# Patient Record
Sex: Male | Born: 1985
Health system: Southern US, Community
[De-identification: ages and names within clinical notes are randomized; demographics above are authoritative.]

## PROBLEM LIST (undated history)

## (undated) DIAGNOSIS — T783XXA Angioneurotic edema, initial encounter: Secondary | ICD-10-CM

## (undated) HISTORY — DX: Angioneurotic edema, initial encounter: T78.3XXA

## (undated) HISTORY — PX: INCISION AND DRAINAGE OF PERITONSILLAR ABCESS: SHX6257

---

## 2010-08-27 ENCOUNTER — Emergency Department (HOSPITAL_COMMUNITY): Payer: BC Managed Care – PPO

## 2010-08-27 ENCOUNTER — Inpatient Hospital Stay (HOSPITAL_COMMUNITY)
Admission: EM | Admit: 2010-08-27 | Discharge: 2010-09-01 | DRG: 057 | Disposition: A | Discharge status: Home / Self Care | Payer: BC Managed Care – PPO | Attending: Otolaryngology | Admitting: Otolaryngology

## 2010-08-27 DIAGNOSIS — J36 Peritonsillar abscess: Principal | ICD-10-CM | POA: Diagnosis present

## 2010-08-27 LAB — DIFFERENTIAL
Basophils Absolute: 0 10*3/uL (ref 0.0–0.1)
Basophils Relative: 0 % (ref 0–1)
Lymphocytes Relative: 10 % — ABNORMAL LOW (ref 12–46)
Monocytes Absolute: 1.5 10*3/uL — ABNORMAL HIGH (ref 0.1–1.0)
Neutro Abs: 11.2 10*3/uL — ABNORMAL HIGH (ref 1.7–7.7)
Neutrophils Relative %: 78 % — ABNORMAL HIGH (ref 43–77)

## 2010-08-27 LAB — CBC
Hemoglobin: 15.3 g/dL (ref 13.0–17.0)
MCHC: 34.5 g/dL (ref 30.0–36.0)
RBC: 5.09 MIL/uL (ref 4.22–5.81)
WBC: 14.4 10*3/uL — ABNORMAL HIGH (ref 4.0–10.5)

## 2010-08-27 LAB — POCT I-STAT, CHEM 8
Chloride: 103 mEq/L (ref 96–112)
HCT: 47 % (ref 39.0–52.0)
Hemoglobin: 16 g/dL (ref 13.0–17.0)
Potassium: 4.6 mEq/L (ref 3.5–5.1)
Sodium: 139 mEq/L (ref 135–145)

## 2010-08-27 LAB — RAPID STREP SCREEN (MED CTR MEBANE ONLY): Streptococcus, Group A Screen (Direct): NEGATIVE

## 2010-08-27 MED ORDER — IOHEXOL 300 MG/ML  SOLN
75.0000 mL | Freq: Once | INTRAMUSCULAR | Status: AC | PRN
  Administered 2010-08-27: 75 mL via INTRAVENOUS

## 2010-08-28 LAB — CBC
Hemoglobin: 14.1 g/dL (ref 13.0–17.0)
MCV: 87 fL (ref 78.0–100.0)
Platelets: 259 10*3/uL (ref 150–400)
RBC: 4.78 MIL/uL (ref 4.22–5.81)
WBC: 14.3 10*3/uL — ABNORMAL HIGH (ref 4.0–10.5)

## 2010-08-28 LAB — DIFFERENTIAL
Eosinophils Absolute: 0.1 10*3/uL (ref 0.0–0.7)
Lymphs Abs: 1.8 10*3/uL (ref 0.7–4.0)
Neutro Abs: 10.7 10*3/uL — ABNORMAL HIGH (ref 1.7–7.7)
Neutrophils Relative %: 75 % (ref 43–77)

## 2010-08-28 LAB — MRSA PCR SCREENING: MRSA by PCR: NEGATIVE

## 2010-08-29 ENCOUNTER — Inpatient Hospital Stay (HOSPITAL_COMMUNITY): Payer: BC Managed Care – PPO

## 2010-08-29 LAB — CBC
HCT: 40.4 % (ref 39.0–52.0)
Hemoglobin: 13.9 g/dL (ref 13.0–17.0)
MCHC: 34.4 g/dL (ref 30.0–36.0)
WBC: 13.7 10*3/uL — ABNORMAL HIGH (ref 4.0–10.5)

## 2010-08-29 MED ORDER — IOHEXOL 300 MG/ML  SOLN: 75.0000 mL | Freq: Once | INTRAMUSCULAR | Status: AC | PRN

## 2010-08-29 NOTE — H&P (Signed)
NAMETORRE, SCHAUMBURG              ACCOUNT NO.:  [id number]  MEDICAL RECORD NO.:  [id number]           PATIENT TYPE:  I  LOCATION:  2918                         FACILITY:  [facility]  PHYSICIAN:  Zola Button T. Lazarus Salines, M.D. DATE OF BIRTH:  1986/01/02  DATE OF ADMISSION:  08/27/2010 DATE OF DISCHARGE:                             HISTORY & PHYSICAL   CHIEF COMPLAINT:  Sore throat.  HISTORY OF PRESENT ILLNESS:  A 25 year old white male noticed the onset of pain first in his left neck like he slept on a wrong 4 days ago. Subsequently, continued pain in his neck and pain with swallowing, progressively worse.  No voice change or airway difficulty.  No fever. No prior similar events.  No immune dysfunction or risk factors.  No bad teeth.  No past history of tonsillitis or strep throat.  He was evaluated in the emergency room including white blood cell count 14,000 and a CT scan with contrast of the neck which shows a thin retropharyngeal fluid accumulation and some parapharyngeal thickening without fluid as well as some loss of the cervical lordosis.  ENT was called in consultation for assistance with management of a presumed infectious process.  The patient denies foreign body ingestion.  He does not smoke nor drink.  No identified bed teeth or recent upper respiratory infections.  No trauma to the neck or throat.  PAST MEDICAL HISTORY:  No known allergies.  He has had wisdom teeth extracted.  No current active medical conditions.  SOCIAL HISTORY:  He is single.  He works in a [employer], contracting. He does not smoke nor drink.  Does not use street drugs.  FAMILY HISTORY:  Negative for anesthesia reactions or bleeding disorders.  REVIEW OF SYSTEMS:  Negative for bleeding disorders.  No chest pain or shortness of breath, belly pain, difficulty passing urine or bowel movements, history of blood clots.  PHYSICAL EXAMINATION:  VITAL SIGNS:  Pulse 103, respirations 16, blood pressure 135/85,  temperature 99.0 degrees. GENERAL:  This is a stocky, robust-appearing young adult white male. Mental status is appropriate. HEENT:  The head is atraumatic and the neck is reasonably supple without pain.  He has a small laceration over his left medial brow from some trauma sustained splitting wood earlier today.  He does not have hot potato voice nor any fetor oris.  He is not warm to touch.  Ears are clear.  Anterior nose shows a rightward septal deviation.  Oral cavity is moist with teeth in good repair.  No obvious bad teeth.  No swelling of the tongue or lips.  The oropharynx shows a 4+ left tonsil and the 3+ right tonsil without obvious exudate.  There is no palatal erythema, swelling or asymmetry.  The uvula is small and in the midline.  I could not further examine the nasopharynx or hypopharynx. NECK:  Tender in the left jugulodigastric region without discrete adenopathy. LUNGS:  Clear to auscultation. HEART:  Regular rate and rhythm and no murmurs. ABDOMEN:  Soft and active. EXTREMITIES:  Normal configuration. SKIN:  Intact. NEUROLOGIC:  Intact.  Following 5 mL of viscous Xylocaine to the nose,  the flexible laryngoscope was introduced.  The nose is clear on both sides. Nasopharynx is clear with normal eustachian tori.  Oropharynx is narrow secondary to large tonsils without obvious exudate.  There is some fullness of the left lateral pharynx with some eventration of the left piriform sinus.  The base of tongue shows moderately prominent lingual tonsils without exudate.  The epiglottis is normal.  Vocal cords are normal and mobile.  There is no significant laryngeal swelling, although there is some obliteration of the left piriform sinus secondary to lateral swelling.  With palpation, the left soft palate is soft and nontender.  LABORATORY DATA:  I reviewed his CT scan and his white count.  ADMISSION DIAGNOSIS:  Parapharyngeal/retropharyngeal phlegmon.  PLAN:  We will  cover him with IV antibiotics.  I talked with the Infectious Disease physicians.  We will choose Unasyn 3 g ams q.6 h.  IV hydration.  We will observe carefully for any clinical progression, which might necessitate a repeat CT scan and possibly even a surgical exploration.     Gloris Manchester. Lazarus Salines, M.D.     KTW/MEDQ  D:  08/27/2010  T:  08/28/2010  Job:  [id number]  Electronically Signed by Flo Shanks M.D. on 08/29/2010 10:50:32 AM

## 2010-08-31 LAB — DIFFERENTIAL
Eosinophils Relative: 3 % (ref 0–5)
Lymphocytes Relative: 25 % (ref 12–46)
Lymphs Abs: 2 10*3/uL (ref 0.7–4.0)
Monocytes Absolute: 1 10*3/uL (ref 0.1–1.0)

## 2010-08-31 LAB — CBC
HCT: 40.5 % (ref 39.0–52.0)
MCH: 29.6 pg (ref 26.0–34.0)
MCHC: 34.1 g/dL (ref 30.0–36.0)
MCV: 86.7 fL (ref 78.0–100.0)
RDW: 12.7 % (ref 11.5–15.5)

## 2010-09-02 LAB — CULTURE, ROUTINE-ABSCESS

## 2010-09-03 LAB — ANAEROBIC CULTURE

## 2010-09-07 NOTE — Discharge Summary (Signed)
Jesse Oneill, Jesse Oneill              ACCOUNT NO.:  [id number]  MEDICAL RECORD NO.:  [id number]           PATIENT TYPE:  I  LOCATION:  5157                         FACILITY:  [facility]  PHYSICIAN:  Zola Button T. Lazarus Salines, M.D. DATE OF BIRTH:  January 12, 1986  DATE OF ADMISSION:  08/27/2010 DATE OF DISCHARGE:  09/01/2010                              DISCHARGE SUMMARY   CHIEF COMPLAINT:  Sore throat.  HISTORY OF PRESENT ILLNESS:  A 25 year old white male noticed the onset of pain first in his left neck like he slept on it wrong 4 days prior to admission.  Subsequently, he had continued pain in his neck and developed pain in his throat, especially with swallowing.  This has been progressively worse.  No voice change or breathing difficulty.  No documented fever.  No prior similar events.  No immune risks or dysfunction.  No obvious dental sources.  No past history of tonsillitis or strep throat.  He was evaluated in the emergency room today with a white blood cell count of 14,000 and a CT scan with contrast of the neck which showed a thin retropharyngeal fluid accumulation, some loss of the cervical lordosis, and some edema of the parapharyngeal tissues.  ENT was called in consultation for management of presumed infectious process.  The patient has no history of trauma to the throat or neck. No foreign body ingestion.  He does not smoke nor drink.  PAST MEDICAL HISTORY:  No known allergies.  No current medications.  He has had his wisdom teeth extracted.  No current active medical conditions.  SOCIAL HISTORY:  He is single.  He works in a Civil engineer, contracting. He does not smoke nor drink.  No street drugs.  FAMILY HISTORY:  Negative for anesthesia reactions or bleeding disorders.  REVIEW OF SYSTEMS:  Negative for bleeding disorders, chest pain, shortness of breath, belly pain, difficulty passing urine or bowel movements or history of deep vein thrombosis.  PHYSICAL EXAMINATION:  GENERAL:   This is a robust but distressed young adult white male.  His voice is clear.  He moves his neck with some reluctance. VITAL SIGNS:  Pulse 103, respirations 16, blood pressure 135/85, temperature 99.0 degrees Fahrenheit. HEENT:  The head has a small laceration over the medial left brow consistent with a workshop injury earlier today.  This does not require any sutures.  No hot potato voice and no significant fetor oris.  He is not warm to touch.  Ears are clear.  Anterior nose shows a rightward septal deviation.  Oral cavity is moist with teeth in apparent good repair.  No swelling of the tongue or lips.  The oropharynx shows swelling of both tonsils, 4+ on the left, 3+ on the right without obvious exudate.  No palatal erythema, swelling, or asymmetry.  The uvula is small and in the midline.  I could not further examine nasopharynx or hypopharynx.  With the use of viscous lidocaine, flexible laryngoscope was introduced.  The nose is clear on both sides. Nasopharynx is clear with normal eustachian tori.  Oropharynx is narrow secondary to the large tonsils without obvious exudate.  Some fullness  of the left lateral pharynx with eventration of the left piriform sinus. Base of tongue shows prominent lingual tonsils without exudate. Epiglottis is normal.  Vocal cords are normal and mobile.  No significant laryngeal swelling.  Airway is good. NECK:  Examination reveals some tenderness in the left jugulodigastric region without discrete adenopathy. LUNGS:  Clear to auscultation. HEART:  Regular rate and rhythm and no murmurs. ABDOMEN:  Soft, active. EXTREMITIES:  Normal configuration. SKIN:  Intact except for the left brow injury. NEUROLOGIC:  Intact.  ADMISSION DIAGNOSIS:  Left peritonsillar/parapharyngeal/retropharyngeal phlegmon.  HOSPITAL COURSE:  He was admitted to the hospital for hydration and intravenous antibiotic therapy.  Infectious Disease was consulted. Unasyn was chosen.   The following morning roughly 12 hours after his admission, white blood cell count was 14.3 thousand and he was perhaps very slightly less tender in the throat.  He was continued in the Intensive Care Unit on antibiotics.  By the following morning, although he remained afebrile, white blood cell count had not reduced very much to 13.7 thousand and he was noting increased neck stiffness and increasing difficulty with swallowing.  A repeat CT scan of the neck with contrast was obtained which showed some increased fullness in the left parapharyngeal region.  Later that same day, he was taken to the operating room where under general anesthesia, exploration of the left peritonsillar space was performed.  Frank pus was encountered at the deep inferior pole either peritonsillar or parapharyngeal which was thoroughly explored bluntly.  The external neck was not opened at this point.  He remained in the Intensive Care Unit. On the fourth hospital day which corresponds to postoperative day #1, he feels like his throat is actually some better.  He was continued on intravenous Unasyn.  By the second postoperative day, he was continuingto feel better and white blood cell count was now 7.8 thousand.  He was transferred to oral Augmentin and the intravenous Unasyn was discontinued.  He was able to advance oral diet.  On hospital day #6, postoperative day #3, he continued to make improvement and was discharged to his home in the care of his family.  FINAL DIAGNOSIS:  Left peritonsillar and parapharyngeal abscess.  PROCEDURE PERFORMED:  Incision and drainage of intraoral left peritonsillar and parapharyngeal abscess, May 29.  COMPLICATIONS:  None.  CONDITION ON DISCHARGE:  Ambulatory.  No apparent progression of infection, on p.o. antibiotics.  Taking a p.o. diet.  Pain improving. Cultures, no growth to date.  Return visit 5 days.  Prescriptions Augmentin and Lortab liquid.  A work excuse was  written for this entire week to return to work on June 4.  Discharge instructions were written and given.     Gloris Manchester. Lazarus Salines, M.D.     KTW/MEDQ  D:  09/01/2010  T:  09/01/2010  Job:  [id number]  Electronically Signed by Flo Shanks M.D. on 09/07/2010 06:18:04 PM

## 2010-09-07 NOTE — Op Note (Signed)
NAMEJOJUAN, CHAMPNEY              ACCOUNT NO.:  [id number]  MEDICAL RECORD NO.:  [id number]           PATIENT TYPE:  I  LOCATION:  2918                         FACILITY:  [facility]  PHYSICIAN:  Zola Button T. Lazarus Salines, M.D. DATE OF BIRTH:  25-Aug-1985  DATE OF PROCEDURE:  08/29/2010 DATE OF DISCHARGE:                              OPERATIVE REPORT   PREOPERATIVE DIAGNOSIS:  Left peritonsillar/parapharyngeal abscess.  POSTOPERATIVE DIAGNOSIS:  Left peritonsillar/parapharyngeal abscess.  PROCEDURE PERFORMED:  Incision and drainage, intraoral, left peritonsillar/parapharyngeal abscess.  SURGEON:  Gloris Manchester. Shereda Graw, MD  ANESTHESIA:  General orotracheal.  BLOOD LOSS:  Minimal.  COMPLICATIONS:  None.  FINDINGS:  3+ right tonsil.  4+ bulging left tonsil with some erythema of the anterior tonsillar pillar but minimal induration.  Normal soft palate.  The posterior pharyngeal wall without any significant palpable thickness or fluctuance.  Left neck with slight fullness in the jugulodigastric region but without frank induration or cellulitis of the skin.  PROCEDURE:  With the patient in a comfortable supine position, general orotracheal anesthesia was induced without difficulty.  At an appropriate level, the table was turned 90 degrees and the patient placed in slight reverse Trendelenburg.  The Anesthesia did notice asymmetric oropharyngeal and hypopharyngeal swelling at the time of intubation.  The epiglottis was basically normal.  Taking care to protect lips, teeth, and endotracheal tube, the Crowe- Davis mouth gag was introduced, expanded for visualization, and suspended from the Mayo stand in the standard fashion.  The findings were as described above.  The oral cavity was carefully palpated as was nasopharynx and hypopharynx.  The neck was also palpated with the findings as described above.  A 3-cm crescent incision was made in front of the anterior and superior poles of the left  tonsil and using the cautery tip for cutting, coagulating, and as a blunt dissector, the peritonsillar plane was developed.  There was mild oozing consistent with inflammation which was controlled with cautery.  There was no obvious purulent collection behind the superior pole or mid pole.  Palpating further down using a large blunt Kelly hemostat, frank pus was encountered lateral and deep to the inferior pole of the left tonsil.  This was cultured for aerobes and anaerobes.  Further blunt dissection with the blunt hemostat and with a fingertip down into the parapharyngeal space was performed with no additional loculations of pus encountered.  The area was thoroughly explored digitally.  Following this, the area was thoroughly irrigated with cool saline.  Hemostasis occurred spontaneously.  After ascertaining hemostasis, the procedure was completed.  The mouth gag was relaxed and removed.  The dental status was intact.  The patient was returned to Anesthesia, awakened, extubated, and transferred to recovery in stable condition.COMMENT:  A 25 year old white male with a 4 - 5-day history of progressively severe left sore throat with an admission CT scan 2 days ago showing early parapharyngeal and retropharyngeal thickening and possible fluid accumulation.  He has been maintained on intravenous Unasyn for 2 days, but clinically is slowly deteriorating with increased neck stiffness and pain with swallowing.  Repeat CT scan today suggested increased swelling in the  parapharyngeal region, hence the indication for today's procedure.  Anticipate routine postoperative recovery with attention to oral hygiene measures, antibiotics.  We will wait the culture results.     Gloris Manchester. Lazarus Salines, M.D.     KTW/MEDQ  D:  08/29/2010  T:  08/30/2010  Job:  [id number]  Electronically Signed by Flo Shanks M.D. on 09/07/2010 06:18:01 PM

## 2012-09-08 ENCOUNTER — Other Ambulatory Visit: Payer: Self-pay | Admitting: Occupational Medicine

## 2012-09-08 ENCOUNTER — Ambulatory Visit
Admission: RE | Admit: 2012-09-08 | Discharge: 2012-09-08 | Disposition: A | Discharge status: Home / Self Care | Payer: No Typology Code available for payment source | Source: Ambulatory Visit | Attending: Occupational Medicine | Admitting: Occupational Medicine

## 2012-09-08 DIAGNOSIS — Z021 Encounter for pre-employment examination: Secondary | ICD-10-CM

## 2014-03-27 ENCOUNTER — Encounter (HOSPITAL_COMMUNITY): Payer: Self-pay | Admitting: Emergency Medicine

## 2014-03-27 ENCOUNTER — Emergency Department (INDEPENDENT_AMBULATORY_CARE_PROVIDER_SITE_OTHER)
Admission: EM | Admit: 2014-03-27 | Discharge: 2014-03-27 | Disposition: A | Discharge status: Home / Self Care | Payer: Worker's Compensation | Source: Home / Self Care | Attending: Family Medicine | Admitting: Family Medicine

## 2014-03-27 ENCOUNTER — Emergency Department (INDEPENDENT_AMBULATORY_CARE_PROVIDER_SITE_OTHER): Payer: Worker's Compensation

## 2014-03-27 DIAGNOSIS — W19XXXA Unspecified fall, initial encounter: Secondary | ICD-10-CM

## 2014-03-27 DIAGNOSIS — S80212A Abrasion, left knee, initial encounter: Secondary | ICD-10-CM

## 2014-03-27 DIAGNOSIS — S8002XA Contusion of left knee, initial encounter: Secondary | ICD-10-CM | POA: Diagnosis not present
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
## 2014-03-27 NOTE — ED Notes (Signed)
Patient injured knee running, he fell knee soreness.

## 2014-03-27 NOTE — Discharge Instructions (Signed)
Your xrays are without evidence of injury to bones. No fracture or dislocation. Ice and elevation to reduce swelling. Ibuprofen as directed on packaging for pain. ACE wrap as needed for comfort. Limit sports and impact exercise for the next 1-2 weeks. If no improvement, [facility]    Contusion A contusion is a deep bruise. Contusions are the result of an injury that caused bleeding under the skin. The contusion may turn blue, purple, or yellow. Minor injuries will give you a painless contusion, but more severe contusions may stay painful and swollen for a few weeks.  CAUSES  A contusion is usually caused by a blow, trauma, or direct force to an area of the body. SYMPTOMS   Swelling and redness of the injured area.  Bruising of the injured area.  Tenderness and soreness of the injured area.  Pain. DIAGNOSIS  The diagnosis can be made by taking a history and physical exam. An X-ray, CT scan, or MRI may be needed to determine if there were any associated injuries, such as fractures. TREATMENT  Specific treatment will depend on what area of the body was injured. In general, the best treatment for a contusion is resting, icing, elevating, and applying cold compresses to the injured area. Over-the-counter medicines may also be recommended for pain control. Ask your caregiver what the best treatment is for your contusion. HOME CARE INSTRUCTIONS   Put ice on the injured area.  Put ice in a plastic bag.  Place a towel between your skin and the bag.  Leave the ice on for 15-20 minutes, 3-4 times a day, or as directed by your health care provider.  Only take over-the-counter or prescription medicines for pain, discomfort, or fever as directed by your caregiver. Your caregiver may recommend avoiding anti-inflammatory medicines (aspirin, ibuprofen, and naproxen) for 48 hours because these medicines may increase bruising.  Rest the injured area.  If possible,  elevate the injured area to reduce swelling. SEEK IMMEDIATE MEDICAL CARE IF:   You have increased bruising or swelling.  You have pain that is getting worse.  Your swelling or pain is not relieved with medicines. MAKE SURE YOU:   Understand these instructions.  Will watch your condition.  Will get help right away if you are not doing well or get worse. Document Released: 12/27/2004 Document Revised: 03/24/2013 Document Reviewed: 01/22/2011 [facility] Patient Information 2015 [employer], [place]. This information is not intended to replace advice given to you by your health care provider. Make sure you discuss any questions you have with your health care provider.  Knee Effusion The medical term for having fluid in your knee is effusion. This is often due to an internal derangement of the knee. This means something is wrong inside the knee. Some of the causes of fluid in the knee may be torn cartilage, a torn ligament, or bleeding into the joint from an injury. Your knee is likely more difficult to bend and move. This is often because there is increased pain and pressure in the joint. The time it takes for recovery from a knee effusion depends on different factors, including:   Type of injury.  Your age.  Physical and medical conditions.  Rehabilitation Strategies. How long you will be away from your normal activities will depend on what kind of knee problem you have and how much damage is present. Your knee has two types of cartilage. Articular cartilage covers the bone ends and lets your knee bend and  move smoothly. Two menisci, thick pads of cartilage that form a rim inside the joint, help absorb shock and stabilize your knee. Ligaments bind the bones together and support your knee joint. Muscles move the joint, help support your knee, and take stress off the joint itself. CAUSES  Often an effusion in the knee is caused by an injury to one of the menisci. This is often a tear in the  cartilage. Recovery after a meniscus injury depends on how much meniscus is damaged and whether you have damaged other knee tissue. Small tears may heal on their own with conservative treatment. Conservative means rest, limited weight bearing activity and muscle strengthening exercises. Your recovery may take up to 6 weeks.  TREATMENT  Larger tears may require surgery. Meniscus injuries may be treated during arthroscopy. Arthroscopy is a procedure in which your surgeon uses a small telescope like instrument to look in your knee. Your caregiver can make a more accurate diagnosis (learning what is wrong) by performing an arthroscopic procedure. If your injury is on the inner margin of the meniscus, your surgeon may trim the meniscus back to a smooth rim. In other cases your surgeon will try to repair a damaged meniscus with stitches (sutures). This may make rehabilitation take longer, but may provide better long term result by helping your knee keep its shock absorption capabilities. Ligaments which are completely torn usually require surgery for repair. HOME CARE INSTRUCTIONS  Use crutches as instructed.  If a brace is applied, use as directed.  Once you are home, an ice pack applied to your swollen knee may help with discomfort and help decrease swelling.  Keep your knee raised (elevated) when you are not up and around or on crutches.  Only take over-the-counter or prescription medicines for pain, discomfort, or fever as directed by your caregiver.  Your caregivers will help with instructions for rehabilitation of your knee. This often includes strengthening exercises.  You may resume a normal diet and activities as directed. SEEK MEDICAL CARE IF:   There is increased swelling in your knee.  You notice redness, swelling, or increasing pain in your knee.  An unexplained oral temperature above 102 F (38.9 C) develops. SEEK IMMEDIATE MEDICAL CARE IF:   You develop a rash.  You have  difficulty breathing.  You have any allergic reactions from medications you may have been given.  There is severe pain with any motion of the knee. MAKE SURE YOU:   Understand these instructions.  Will watch your condition.  Will get help right away if you are not doing well or get worse. Document Released: 06/09/2003 Document Revised: 06/11/2011 Document Reviewed: 08/13/2007 [facility] Patient Information 2015 [employer], [place]. This information is not intended to replace advice given to you by your health care provider. Make sure you discuss any questions you have with your health care provider.

## 2015-02-01 LAB — HM HEPATITIS C SCREENING LAB: HM Hepatitis Screen: NEGATIVE

## 2015-08-03 IMAGING — CR DG KNEE COMPLETE 4+V*L*
5 series · 5 of 5 positions shown · non-contrast
Comparison: None.

CLINICAL DATA: Left anterior knee pain and abrasions post fall last
night

EXAM:
LEFT KNEE - COMPLETE 4+ VIEW

[knee ap]
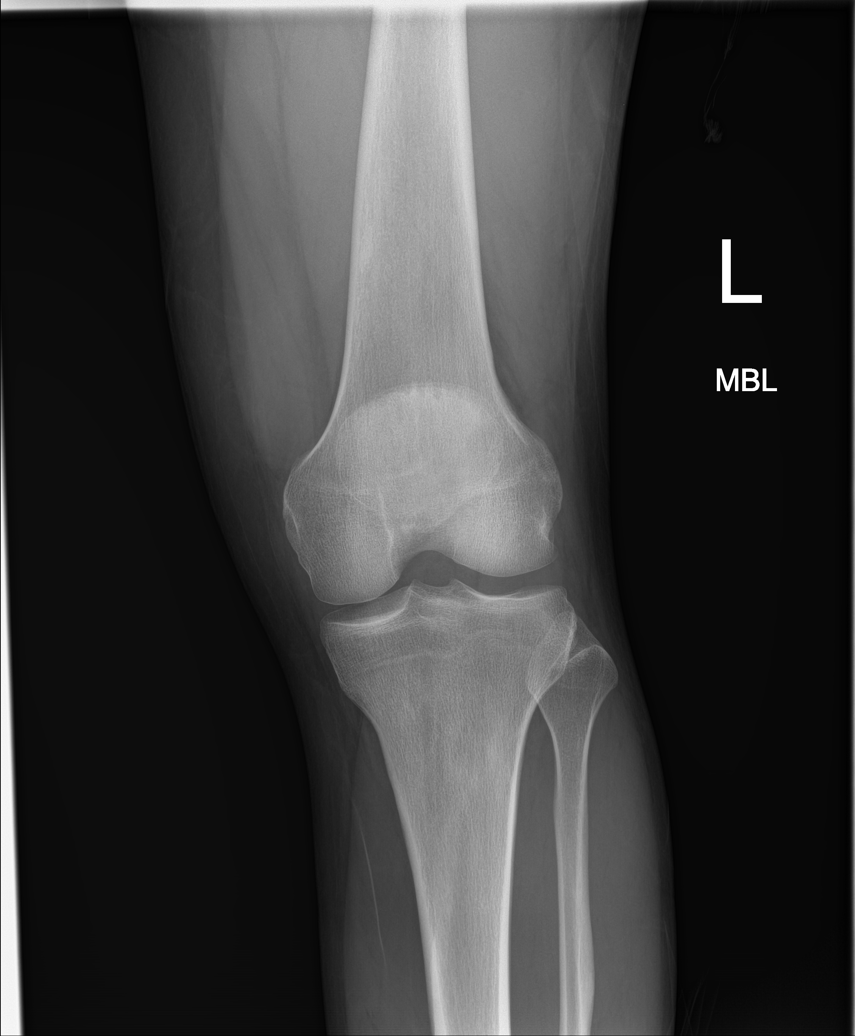

[knee lat (1 of 2)]
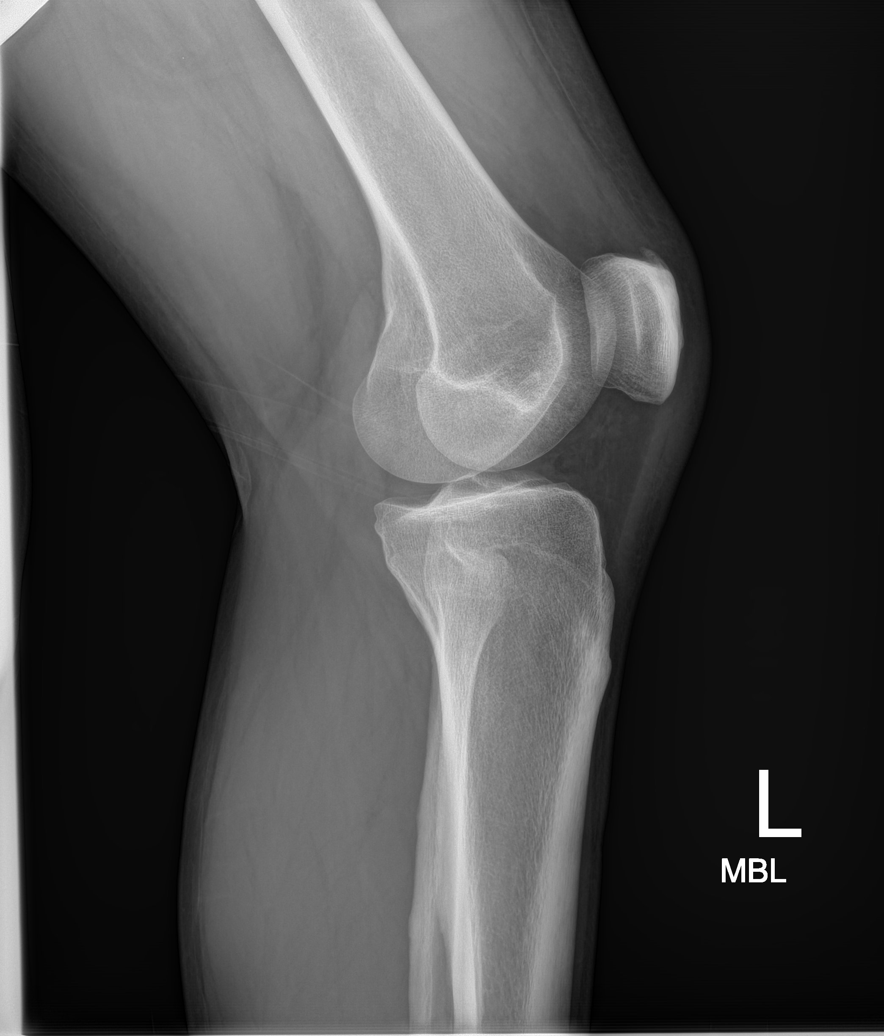

[knee obl (1 of 2)]
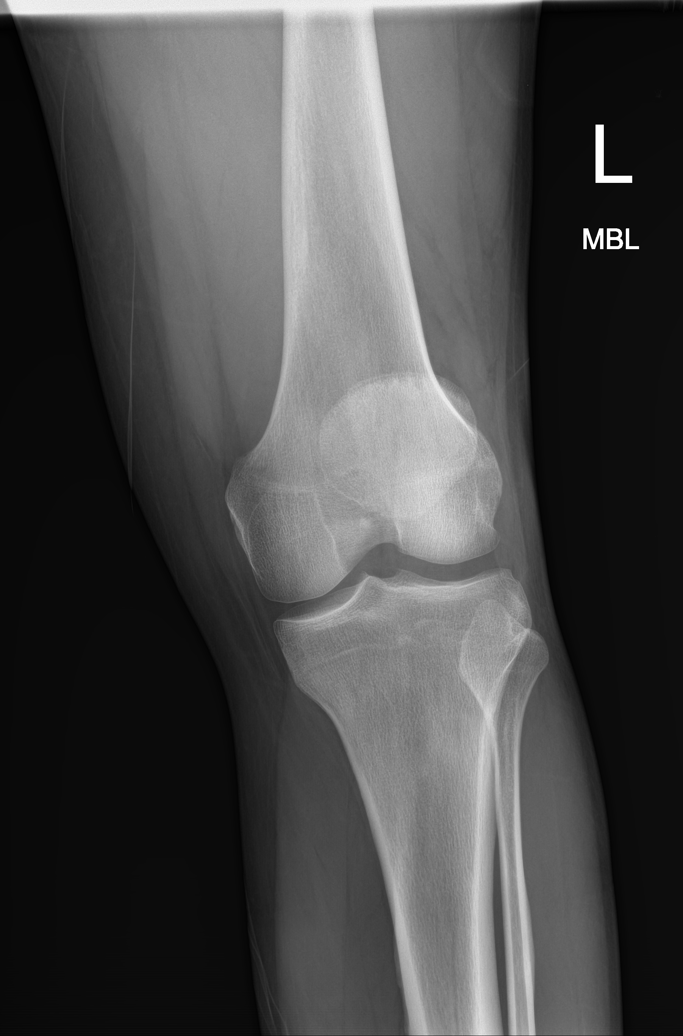

[knee obl (2 of 2)]
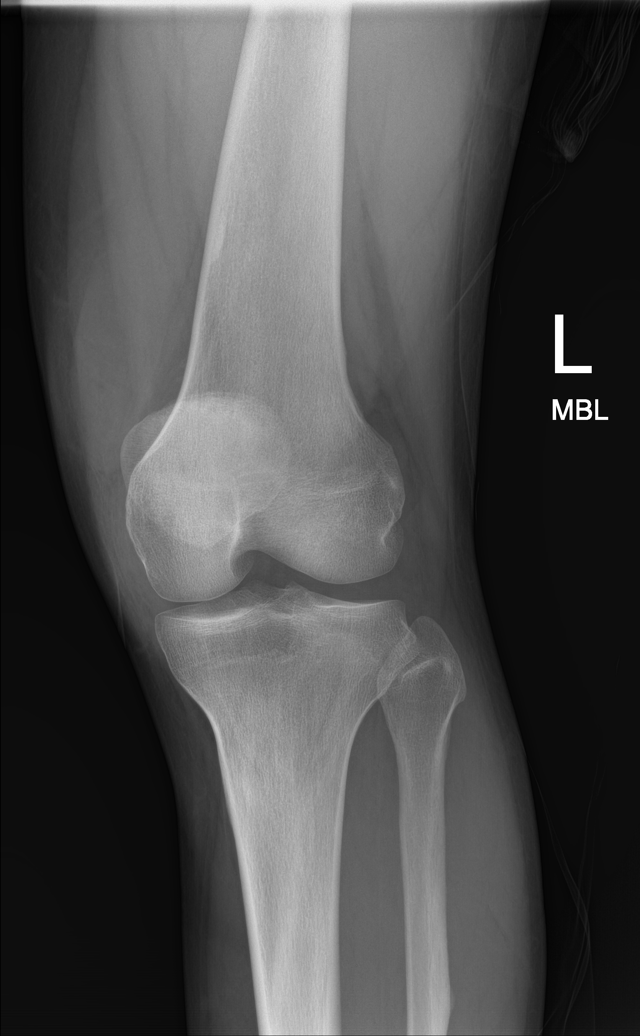

[knee lat (2 of 2)]
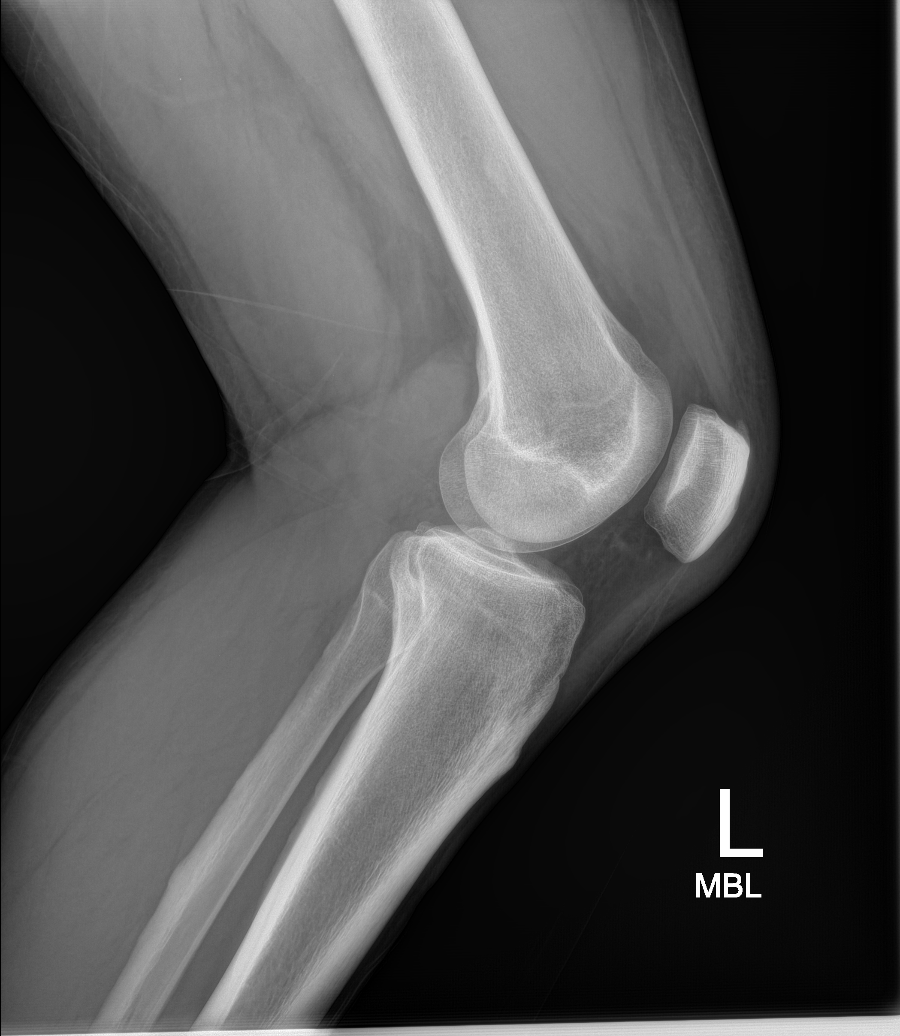

[5 of 5 positions shown; findings below may reference images not displayed]

FINDINGS: Five views of the left knee submitted. No acute fracture or
subluxation. Mild narrowing of medial joint compartment. Mild
spurring of patella. Mild prepatellar soft tissue swelling. No joint
effusion.
IMPRESSION: No acute fracture or subluxation. Mild narrowing of medial joint
compartment. Mild spurring of patella. Mild prepatellar soft tissue
swelling.

## 2016-01-10 ENCOUNTER — Ambulatory Visit (INDEPENDENT_AMBULATORY_CARE_PROVIDER_SITE_OTHER): Payer: 59 | Admitting: Family Medicine

## 2016-01-10 ENCOUNTER — Encounter: Payer: Self-pay | Admitting: Family Medicine

## 2016-01-10 ENCOUNTER — Ambulatory Visit: Payer: Self-pay | Admitting: Family Medicine

## 2016-01-10 VITALS — BP 122/82 | HR 73 | Temp 98.6°F | Ht 75.0 in | Wt 250.5 lb

## 2016-01-10 DIAGNOSIS — Z7189 Other specified counseling: Secondary | ICD-10-CM

## 2016-01-10 DIAGNOSIS — Z0000 Encounter for general adult medical examination without abnormal findings: Secondary | ICD-10-CM

## 2016-01-10 NOTE — Progress Notes (Signed)
Pre visit review using our clinic review tool, if applicable. No additional management support is needed unless otherwise documented below in the visit note. 

## 2016-01-10 NOTE — Patient Instructions (Signed)
Take care.  Glad to see you. Update me as needed.  

## 2016-01-11 ENCOUNTER — Encounter: Payer: Self-pay | Admitting: Family Medicine

## 2016-01-11 DIAGNOSIS — Z7189 Other specified counseling: Secondary | ICD-10-CM | POA: Insufficient documentation

## 2016-01-11 DIAGNOSIS — Z0000 Encounter for general adult medical examination without abnormal findings: Secondary | ICD-10-CM | POA: Insufficient documentation

## 2016-01-11 NOTE — Assessment & Plan Note (Signed)
Tetanus 2015 Flu to be done at work.  PNA and shingles not due.  Colon and prostate cancer screening not due D/w pt about diet and exercise.   Advance directive d/w pt.  Wife designated if patient were incapacitated.   [facility].

## 2016-01-11 NOTE — Assessment & Plan Note (Signed)
Advance directive d/w pt.  Wife designated if patient were incapacitated.   
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
## 2018-11-22 ENCOUNTER — Encounter (HOSPITAL_COMMUNITY): Payer: Self-pay

## 2018-11-22 ENCOUNTER — Other Ambulatory Visit: Payer: Self-pay

## 2018-11-22 ENCOUNTER — Emergency Department (HOSPITAL_COMMUNITY)
Admission: EM | Admit: 2018-11-22 | Discharge: 2018-11-22 | Disposition: A | Discharge status: Home / Self Care | Payer: No Typology Code available for payment source | Attending: Emergency Medicine | Admitting: Emergency Medicine

## 2018-11-22 DIAGNOSIS — W01118A Fall on same level from slipping, tripping and stumbling with subsequent striking against other sharp object, initial encounter: Secondary | ICD-10-CM | POA: Diagnosis not present

## 2018-11-22 DIAGNOSIS — Y990 Civilian activity done for income or pay: Secondary | ICD-10-CM | POA: Insufficient documentation

## 2018-11-22 DIAGNOSIS — S81811A Laceration without foreign body, right lower leg, initial encounter: Secondary | ICD-10-CM | POA: Diagnosis present

## 2018-11-22 DIAGNOSIS — Y939 Activity, unspecified: Secondary | ICD-10-CM | POA: Diagnosis not present

## 2018-11-22 DIAGNOSIS — Y929 Unspecified place or not applicable: Secondary | ICD-10-CM | POA: Diagnosis not present

## 2018-11-22 MED ORDER — HYDROCODONE-ACETAMINOPHEN 5-325 MG PO TABS: 1.0000 | ORAL_TABLET | ORAL | 0 refills | Status: DC | PRN

## 2018-11-22 MED ORDER — IBUPROFEN 600 MG PO TABS: 600.0000 mg | ORAL_TABLET | Freq: Four times a day (QID) | ORAL | 0 refills | Status: DC | PRN

## 2018-11-22 MED ORDER — LIDOCAINE-EPINEPHRINE (PF) 2 %-1:200000 IJ SOLN
10.0000 mL | Freq: Once | INTRAMUSCULAR | Status: AC
  Administered 2018-11-22: 10 mL
  Filled 2018-11-22: qty 10

## 2018-11-22 NOTE — ED Provider Notes (Signed)
[facility]-EMERGENCY DEPT Provider Note   CSN: [id number] Arrival date & time: 11/22/18  0341     History   Chief Complaint Chief Complaint  Patient presents with  . Laceration    HPI Jesse Oneill is a 33 y.o. male.     Patient to ED with laceration to right lower leg. He fell against an exposed edge of sheet metal just prior to arrival in the ED. No other injury. No numbness or active bleeding. Tetanus without 3 years.   The history is provided by the patient. No language interpreter was used.  Laceration   History reviewed. No pertinent past medical history.  Patient Active Problem List   Diagnosis Date Noted  . Routine general medical examination at a health care facility 01/11/2016  . Advance care planning 01/11/2016    Past Surgical History:  Procedure Laterality Date  . INCISION AND DRAINAGE OF PERITONSILLAR ABCESS          Home Medications    Prior to Admission medications   Not on File    Family History Family History  Problem Relation Age of Onset  . Colon cancer Neg Hx   . Prostate cancer Neg Hx     Social History Social History   Tobacco Use  . Smoking status: Never Smoker  . Smokeless tobacco: Never Used  Substance Use Topics  . Alcohol use: No  . Drug use: No     Allergies   Patient has no known allergies.   Review of Systems Review of Systems  Musculoskeletal:       See HPI.  Skin: Positive for wound.  Neurological: Negative for numbness.     Physical Exam Updated Vital Signs BP (!) 136/97 (BP Location: Left Arm)   Pulse 95   Temp 98.3 F (36.8 C) (Oral)   Resp 16   SpO2 100%   Physical Exam Vitals signs and nursing note reviewed.  Constitutional:      Appearance: Normal appearance.  Musculoskeletal: Normal range of motion.        General: No swelling.  Skin:    General: Skin is warm and dry.     Comments: 9 cm flap laceration to anterolateral, midshaft right lower leg. No active  bleeding. No surrounding swelling or redness.   Neurological:     Mental Status: He is alert.      ED Treatments / Results  Labs (all labs ordered are listed, but only abnormal results are displayed) Labs Reviewed - No data to display  EKG None  Radiology No results found.  Procedures .Marland Kitchen.Laceration Repair  Date/Time: 11/22/2018 6:37 AM Performed by: Elpidio AnisUpstill, Janele Lague, PA-C Authorized by: Elpidio AnisUpstill, Dionna Wiedemann, PA-C   Consent:    Consent obtained:  Verbal   Consent given by:  Patient Anesthesia (see MAR for exact dosages):    Anesthesia method:  Local infiltration   Local anesthetic:  Lidocaine 2% WITH epi Laceration details:    Location:  Leg   Leg location:  R lower leg   Length (cm):  9 Repair type:    Repair type:  Intermediate Pre-procedure details:    Preparation:  Patient was prepped and draped in usual sterile fashion Exploration:    Hemostasis achieved with:  Direct pressure   Wound exploration: entire depth of wound probed and visualized     Wound extent: no foreign bodies/material noted and no muscle damage noted     Contaminated: no   Treatment:    Area cleansed with:  Betadine and saline   Amount of cleaning:  Standard   Irrigation solution:  Sterile saline   Irrigation method:  Syringe Skin repair:    Repair method:  Sutures   Suture size:  3-0   Suture material:  Nylon   Suture technique:  Simple interrupted   Number of sutures:  12 Approximation:    Approximation:  Close Post-procedure details:    Dressing:  Antibiotic ointment and non-adherent dressing   Patient tolerance of procedure:  Tolerated well, no immediate complications   (including critical care time)  Medications Ordered in ED Medications  lidocaine-EPINEPHrine (XYLOCAINE W/EPI) 2 %-1:200000 (PF) injection 10 mL (10 mLs Infiltration Given 11/22/18 0604)     Initial Impression / Assessment and Plan / ED Course  I have reviewed the triage vital signs and the nursing notes.  Pertinent  labs & imaging results that were available during my care of the patient were reviewed by me and considered in my medical decision making (see chart for details).        Patient to ED with full thickness laceration to right lower leg that occurred just before coming to ED tonight. No other injury. Repaired as per above note.   Final Clinical Impressions(s) / ED Diagnoses   Final diagnoses:  None   1. Laceration, right LE  ED Discharge Orders    None       Charlann Lange, PA-C 11/22/18 7829    Shanon Rosser, MD 11/22/18 [phone]

## 2018-11-22 NOTE — ED Triage Notes (Addendum)
Pt reports that a piece of sheet metal sliced the R lower leg about 6 inches above the ankle. Adipose tissue visualized. Bleeding controlled. Ambulatory. Workers comp.

## 2019-01-09 ENCOUNTER — Encounter: Payer: Self-pay | Admitting: Family Medicine

## 2019-01-09 ENCOUNTER — Ambulatory Visit (INDEPENDENT_AMBULATORY_CARE_PROVIDER_SITE_OTHER): Payer: 59 | Admitting: Family Medicine

## 2019-01-09 ENCOUNTER — Other Ambulatory Visit: Payer: Self-pay

## 2019-01-09 VITALS — BP 122/74 | HR 92 | Temp 97.8°F | Ht 77.0 in | Wt 258.8 lb

## 2019-01-09 DIAGNOSIS — Z131 Encounter for screening for diabetes mellitus: Secondary | ICD-10-CM | POA: Diagnosis not present

## 2019-01-09 DIAGNOSIS — Z0000 Encounter for general adult medical examination without abnormal findings: Secondary | ICD-10-CM | POA: Diagnosis not present

## 2019-01-09 DIAGNOSIS — Z8249 Family history of ischemic heart disease and other diseases of the circulatory system: Secondary | ICD-10-CM

## 2019-01-09 DIAGNOSIS — Z7189 Other specified counseling: Secondary | ICD-10-CM

## 2019-01-09 LAB — LIPID PANEL
Cholesterol: 152 mg/dL (ref 0–200)
HDL: 57.3 mg/dL (ref >39.00)
LDL Cholesterol: 79 mg/dL (ref 0–99)
NonHDL: 94.68
Total CHOL/HDL Ratio: 3
Triglycerides: 76 mg/dL (ref 0.0–149.0)
VLDL: 15.2 mg/dL (ref 0.0–40.0)

## 2019-01-09 LAB — GLUCOSE, RANDOM: Glucose, Bld: 88 mg/dL (ref 70–99)

## 2019-01-09 NOTE — Patient Instructions (Signed)
Go to the lab on the way out.  We'll contact you with your lab report. Keep working on diet and exercise.   I would get a flu shot each fall.   Take care.  Glad to see you.  Update me as needed.

## 2019-01-09 NOTE — Progress Notes (Signed)
CPE- See plan.  Routine anticipatory guidance given to patient.  See health maintenance.  The possibility exists that previously documented standard health maintenance information may have been brought forward from a previous encounter into this note.  If needed, that same information has been updated to reflect the current situation based on today's encounter.    Tetanus 2015 Flu to be done at work.  PNA and shingles not due.  Colon and prostate cancer screening not due D/w pt about diet and exercise.   Advance directive d/w pt.  Wife designated if patient were incapacitated.   [facility]    See notes on labs.  He prev had elevated BP out of clinic that improved with diet and exercise.  Prev weight up to 280 per patient report, with intentional weight loss in the meantime.    FH CAD noted but not parents.    PMH and SH reviewed  Meds, vitals, and allergies reviewed.   ROS: Per HPI.  Unless specifically indicated otherwise in HPI, the patient denies:  General: fever. Eyes: acute vision changes ENT: sore throat Cardiovascular: chest pain Respiratory: SOB GI: vomiting GU: dysuria Musculoskeletal: acute back pain Derm: acute rash Neuro: acute motor dysfunction Psych: worsening mood Endocrine: polydipsia Heme: bleeding Allergy: hayfever  GEN: nad, alert and oriented HEENT: ncat NECK: supple w/o LA CV: rrr. PULM: ctab, no inc wob ABD: soft, +bs EXT: no edema SKIN: no acute rash

## 2019-01-11 NOTE — Assessment & Plan Note (Signed)
Tetanus 2015 Flu to be done at work.  PNA and shingles not due.  Colon and prostate cancer screening not due D/w pt about diet and exercise.   Advance directive d/w pt.  Wife designated if patient were incapacitated.   [facility].    He prev had elevated BP out of clinic that improved with diet and exercise.  Prev weight up to 280 per patient report, with intentional weight loss in the meantime.

## 2019-01-11 NOTE — Assessment & Plan Note (Signed)
Advance directive d/w pt.  Wife designated if patient were incapacitated.   

## 2019-01-14 ENCOUNTER — Telehealth: Payer: Self-pay

## 2019-01-14 ENCOUNTER — Encounter: Payer: Self-pay | Admitting: Family Medicine

## 2019-01-14 NOTE — Telephone Encounter (Signed)
I left a message for patient to return phone call regarding lab results.

## 2019-11-12 ENCOUNTER — Ambulatory Visit
Admission: EM | Admit: 2019-11-12 | Discharge: 2019-11-12 | Disposition: A | Discharge status: Home / Self Care | Payer: BC Managed Care – PPO | Attending: Internal Medicine | Admitting: Internal Medicine

## 2019-11-12 ENCOUNTER — Other Ambulatory Visit: Payer: Self-pay

## 2019-11-12 DIAGNOSIS — Z0189 Encounter for other specified special examinations: Secondary | ICD-10-CM

## 2019-11-12 NOTE — Discharge Instructions (Signed)
Your COVID test will take about 48-72 hours to come back. The results will automatically go to your MyChart account; please set this up if you have no already done so. We will only call you if your test results are positive.

## 2019-11-14 LAB — SARS-COV-2, NAA 2 DAY TAT

## 2019-11-14 LAB — NOVEL CORONAVIRUS, NAA: SARS-CoV-2, NAA: NOT DETECTED

## 2021-09-26 ENCOUNTER — Encounter: Payer: BC Managed Care – PPO | Admitting: Family Medicine

## 2021-09-29 ENCOUNTER — Ambulatory Visit (INDEPENDENT_AMBULATORY_CARE_PROVIDER_SITE_OTHER): Payer: BC Managed Care – PPO | Admitting: Family Medicine

## 2021-09-29 ENCOUNTER — Encounter: Payer: Self-pay | Admitting: Family Medicine

## 2021-09-29 VITALS — BP 122/82 | HR 82 | Temp 97.5°F | Ht 77.0 in | Wt 299.0 lb

## 2021-09-29 DIAGNOSIS — R635 Abnormal weight gain: Secondary | ICD-10-CM | POA: Diagnosis not present

## 2021-09-29 DIAGNOSIS — Z7189 Other specified counseling: Secondary | ICD-10-CM

## 2021-09-29 DIAGNOSIS — Z0000 Encounter for general adult medical examination without abnormal findings: Secondary | ICD-10-CM | POA: Diagnosis not present

## 2021-09-29 DIAGNOSIS — R0683 Snoring: Secondary | ICD-10-CM

## 2021-09-29 LAB — BASIC METABOLIC PANEL
BUN: 14 mg/dL (ref 6–23)
CO2: 26 mEq/L (ref 19–32)
Calcium: 9.4 mg/dL (ref 8.4–10.5)
Chloride: 104 mEq/L (ref 96–112)
Creatinine, Ser: 1.05 mg/dL (ref 0.40–1.50)
GFR: 92.63 mL/min (ref >60.00)
Glucose, Bld: 93 mg/dL (ref 70–99)
Potassium: 4.2 mEq/L (ref 3.5–5.1)
Sodium: 139 mEq/L (ref 135–145)

## 2021-09-29 LAB — LIPID PANEL
Cholesterol: 154 mg/dL (ref 0–200)
HDL: 46.1 mg/dL (ref >39.00)
LDL Cholesterol: 73 mg/dL (ref 0–99)
NonHDL: 107.97
Total CHOL/HDL Ratio: 3
Triglycerides: 176 mg/dL — ABNORMAL HIGH (ref 0.0–149.0)
VLDL: 35.2 mg/dL (ref 0.0–40.0)

## 2021-09-29 LAB — TSH: TSH: 1.4 u[IU]/mL (ref 0.35–5.50)

## 2021-09-29 NOTE — Progress Notes (Unsigned)
CPE- See plan.  Routine anticipatory guidance given to patient.  See health maintenance.  The possibility exists that previously documented standard health maintenance information may have been brought forward from a previous encounter into this note.  If needed, that same information has been updated to reflect the current situation based on today's encounter.    Tetanus 2015 Flu d/w pt.  Encouraged for the fall.   Covid vaccine d/w pt.   PNA and shingles not due.  Colon and prostate cancer screening not due D/w pt about diet and exercise.   Advance directive d/w pt.  Wife designated if patient were incapacitated.   [facility] and [facility].     Wife noted patient was having breathing changes at night.  D/w pt about options.  He occ has some fatigue but he has other factors that could contribute to that (with work and raising kids). No red flag events.   Weight is up in the meantime.  See notes on labs.    PMH and SH reviewed  Meds, vitals, and allergies reviewed.   ROS: Per HPI.  Unless specifically indicated otherwise in HPI, the patient denies:  General: fever. Eyes: acute vision changes ENT: sore throat Cardiovascular: chest pain Respiratory: SOB GI: vomiting GU: dysuria Musculoskeletal: acute back pain Derm: acute rash Neuro: acute motor dysfunction Psych: worsening mood Endocrine: polydipsia Heme: bleeding Allergy: hayfever  GEN: nad, alert and oriented HEENT: ncat NECK: supple w/o LA CV: rrr. PULM: ctab, no inc wob ABD: soft, +bs EXT: no edema SKIN: no acute rash

## 2021-09-29 NOTE — Patient Instructions (Signed)
Go to the lab on the way out.   If you have mychart we'll likely use that to update you.    We'll call about seeing pulmonary for consideration of a sleep test.  Take care.  Glad to see you. Use the eat right diet in the meantime.

## 2021-10-01 DIAGNOSIS — G4733 Obstructive sleep apnea (adult) (pediatric): Secondary | ICD-10-CM

## 2021-10-01 DIAGNOSIS — R0683 Snoring: Secondary | ICD-10-CM | POA: Insufficient documentation

## 2021-10-01 HISTORY — DX: Obstructive sleep apnea (adult) (pediatric): G47.33

## 2021-10-01 NOTE — Assessment & Plan Note (Signed)
Discussed possible diagnosis of sleep apnea and sleep apnea pathophysiology.  Refer to pulmonary.  Routine cautions given to patient regarding being drowsy. Weight is up in the meantime.  See notes on labs.   Discussed diet and exercise, packing his lunch, using a right/low-carb diet.  Handout given to patient and explained/understood.

## 2021-10-01 NOTE — Assessment & Plan Note (Signed)
Advance directive d/w pt.  Wife designated if patient were incapacitated.   

## 2021-10-01 NOTE — Assessment & Plan Note (Signed)
Tetanus 2015 Flu d/w pt.  Encouraged for the fall.   Covid vaccine d/w pt.   PNA and shingles not due.  Colon and prostate cancer screening not due D/w pt about diet and exercise.   Advance directive d/w pt.  Wife designated if patient were incapacitated.   [facility].

## 2021-11-16 ENCOUNTER — Telehealth: Payer: Self-pay

## 2021-11-16 NOTE — Telephone Encounter (Signed)
Noted  

## 2021-11-16 NOTE — Telephone Encounter (Signed)
Lm for patient to ask if he has had previous sleep study.  

## 2021-11-17 ENCOUNTER — Encounter: Payer: Self-pay | Admitting: Primary Care

## 2021-11-17 ENCOUNTER — Ambulatory Visit (INDEPENDENT_AMBULATORY_CARE_PROVIDER_SITE_OTHER): Payer: BC Managed Care – PPO | Admitting: Primary Care

## 2021-11-17 VITALS — BP 126/74 | HR 90 | Temp 97.8°F | Ht 76.0 in | Wt 298.0 lb

## 2021-11-17 DIAGNOSIS — R0683 Snoring: Secondary | ICD-10-CM | POA: Diagnosis not present

## 2021-11-17 NOTE — Assessment & Plan Note (Signed)
-   Patient has symptoms of loud snoring, witnessed apnea and daytime sleepiness.  Epworth score 13.  BMI is 36.  Concern patient could have underlying obstructive sleep apnea, needs home sleep study to evaluate.  Discussed risks of untreated sleep apnea including cardiac arrhythmias, pulmonary hypertension, stroke and diabetes.  Reviewed treatment options including weight loss, oral appliance, CPAP therapy or referral to ENT for possible surgical options.  Encouraged side sleeping position and weight loss.  Advised against driving if experiencing excessive daytime sleepiness or fatigue.  Follow-up 1 to 2 weeks after completing sleep study to review results and treatment options if needed.

## 2021-11-17 NOTE — Patient Instructions (Addendum)
Sleep apnea is defined as period of 10 seconds or longer when you stop breathing at night. This can happen multiple times a night. Dx sleep apnea is when this occurs more than 5 times an hour.    Mild OSA 5-15 apneic events an hour Moderate OSA 15-30 apneic events an hour Severe OSA > 30 apneic events an hour   Untreated sleep apnea puts you at higher risk for cardiac arrhythmias, pulmonary HTN, stroke and diabetes   Treatment options include weight loss, side sleeping position, oral appliance, CPAP therapy or referral to ENT for possible surgical options    Recommendations: Focus on side sleeping position or elevate head of bed at night  Maintain healthy weight  Do not drive if experiencing excessive daytime sleepiness of fatigue    Orders: Home sleep study re: loud snoring    Follow-up: Please schedule follow-up 1-2 weeks after completing home sleep study to review results and treatment if needed    Sleep Apnea Sleep apnea affects breathing during sleep. It causes breathing to stop for 10 seconds or more, or to become shallow. People with sleep apnea usually snore loudly. It can also increase the risk of: Heart attack. Stroke. Being very overweight (obese). Diabetes. Heart failure. Irregular heartbeat. High blood pressure. The goal of treatment is to help you breathe normally again. What are the causes?  The most common cause of this condition is a collapsed or blocked airway. There are three kinds of sleep apnea: Obstructive sleep apnea. This is caused by a blocked or collapsed airway. Central sleep apnea. This happens when the brain does not send the right signals to the muscles that control breathing. Mixed sleep apnea. This is a combination of obstructive and central sleep apnea. What increases the risk? Being overweight. Smoking. Having a small airway. Being older. Being male. Drinking alcohol. Taking medicines to calm yourself (sedatives or  tranquilizers). Having family members with the condition. Having a tongue or tonsils that are larger than normal. What are the signs or symptoms? Trouble staying asleep. Loud snoring. Headaches in the morning. Waking up gasping. Dry mouth or sore throat in the morning. Being sleepy or tired during the day. If you are sleepy or tired during the day, you may also: Not be able to focus your mind (concentrate). Forget things. Get angry a lot and have mood swings. Feel sad (depressed). Have changes in your personality. Have less interest in sex, if you are male. Be unable to have an erection, if you are male. How is this treated?  Sleeping on your side. Using a medicine to get rid of mucus in your nose (decongestant). Avoiding the use of alcohol, medicines to help you relax, or certain pain medicines (narcotics). Losing weight, if needed. Changing your diet. Quitting smoking. Using a machine to open your airway while you sleep, such as: An oral appliance. This is a mouthpiece that shifts your lower jaw forward. A CPAP device. This device blows air through a mask when you breathe out (exhale). An EPAP device. This has valves that you put in each nostril. A BIPAP device. This device blows air through a mask when you breathe in (inhale) and breathe out. Having surgery if other treatments do not work. Follow these instructions at home: Lifestyle Make changes that your doctor recommends. Eat a healthy diet. Lose weight if needed. Avoid alcohol, medicines to help you relax, and some pain medicines. Do not smoke or use any products that contain nicotine or tobacco. If you need  help quitting, ask your doctor. General instructions Take over-the-counter and prescription medicines only as told by your doctor. If you were given a machine to use while you sleep, use it only as told by your doctor. If you are having surgery, make sure to tell your doctor you have sleep apnea. You may need to  bring your device with you. Keep all follow-up visits. Contact a doctor if: The machine that you were given to use during sleep bothers you or does not seem to be working. You do not get better. You get worse. Get help right away if: Your chest hurts. You have trouble breathing in enough air. You have an uncomfortable feeling in your back, arms, or stomach. You have trouble talking. One side of your body feels weak. A part of your face is hanging down. These symptoms may be an emergency. Get help right away. Call your local emergency services (911 in the [place] Do not wait to see if the symptoms will go away. Do not drive yourself to the hospital. Summary This condition affects breathing during sleep. The most common cause is a collapsed or blocked airway. The goal of treatment is to help you breathe normally while you sleep. This information is not intended to replace advice given to you by your health care provider. Make sure you discuss any questions you have with your health care provider. Document Revised: 10/26/2020 Document Reviewed: 02/26/2020 Elsevier Patient Education  2023 [employer].

## 2021-11-17 NOTE — Progress Notes (Signed)
@Patient  ID: , male    DOB: Sep 08, 1985, 36 y.o.   MRN: [id number]  Chief Complaint  Patient presents with   sleep consult    No prior sleep study. Daytime sleepiness, gasping for air during sleep and witnessed apneas.     Referring provider: [id number], MD  HPI: 36 year old male, never smoked. No significant past medical history.   11/17/2021 Patient presents today for sleep consult.  Patient has symptoms of loud snoring, witnessed apnea and daytime sleepiness.  His wife has told him that he sometimes stop breathing at night.  He experiences fatigue even after plenty of rest.  His typical bedtime is between 8 and 10 PM.  He falls asleep quickly.  He does not wake up often at night.  He starts his day between 2 AM and 5 AM depending on his schedule.  He does operate heavy machinery for work.  He has gained 30 to 40 pounds in the last 2 years.  No previous sleep study. Epworth score 13. Denies narcolepsy, cataplexy or sleep walking.   Sleep questionnaire Symptoms- Loud snoring, witnessed apnea, restless sleep, daytime sleepiness Prior sleep study- None  Bedtime- 8-10pm Time to fall asleep- < 10 mins typically   Nocturnal awakenings- None, maybe once to use restroom Out of bed/start of day- 2am-5am  Weight changes- 30-40lbs Do you operate heavy machinery- Yes Do you currently wear CPAP- No Do you current wear oxygen- No Epworth- 13  No Known Allergies  Immunization History  Administered Date(s) Administered   Tdap 04/02/2013    No past medical history on file.  Tobacco History: Social History   Tobacco Use  Smoking Status Never  Smokeless Tobacco Never   Counseling given: Not Answered   No outpatient medications prior to visit.   No facility-administered medications prior to visit.   Review of Systems  Review of Systems  Constitutional:  Positive for fatigue.  HENT: Negative.    Respiratory:  Positive for apnea.   Psychiatric/Behavioral:   Positive for sleep disturbance.    Physical Exam  BP 126/74 (BP Location: Left Arm, Cuff Size: Normal)   Pulse 90   Temp 97.8 F (36.6 C) (Temporal)   Ht 6\' 4"  (1.93 m)   Wt 298 lb (135.2 kg)   SpO2 96%   BMI 36.27 kg/m  Physical Exam Constitutional:      Appearance: Normal appearance.  HENT:     Head: Normocephalic and atraumatic.     Mouth/Throat:     Mouth: Mucous membranes are moist.     Pharynx: Oropharynx is clear.     Comments: Mallampati class I Cardiovascular:     Rate and Rhythm: Normal rate and regular rhythm.  Pulmonary:     Effort: Pulmonary effort is normal.     Breath sounds: Normal breath sounds.  Musculoskeletal:     Cervical back: Normal range of motion and neck supple.  Skin:    General: Skin is warm and dry.  Neurological:     General: No focal deficit present.     Mental Status: He is alert and oriented to person, place, and time. Mental status is at baseline.  Psychiatric:        Mood and Affect: Mood normal.        Behavior: Behavior normal.        Thought Content: Thought content normal.        Judgment: Judgment normal.      Lab Results:  CBC  Component Value Date/Time   WBC 7.9 08/31/2010 0705   RBC 4.67 08/31/2010 0705   HGB 13.8 08/31/2010 0705   HCT 40.5 08/31/2010 0705   PLT 337 08/31/2010 0705   MCV 86.7 08/31/2010 0705   MCH 29.6 08/31/2010 0705   MCHC 34.1 08/31/2010 0705   RDW 12.7 08/31/2010 0705   LYMPHSABS 2.0 08/31/2010 0705   MONOABS 1.0 08/31/2010 0705   EOSABS 0.2 08/31/2010 0705   BASOSABS 0.1 08/31/2010 0705    BMET    Component Value Date/Time   NA 139 09/29/2021 1152   K 4.2 09/29/2021 1152   CL 104 09/29/2021 1152   CO2 26 09/29/2021 1152   GLUCOSE 93 09/29/2021 1152   BUN 14 09/29/2021 1152   CREATININE 1.05 09/29/2021 1152   CALCIUM 9.4 09/29/2021 1152    BNP No results found for: "BNP"  ProBNP No results found for: "PROBNP"  Imaging: No results found.   Assessment & Plan:    Snoring - Patient has symptoms of loud snoring, witnessed apnea and daytime sleepiness.  Epworth score 13.  BMI is 36.  Concern patient could have underlying obstructive sleep apnea, needs home sleep study to evaluate.  Discussed risks of untreated sleep apnea including cardiac arrhythmias, pulmonary hypertension, stroke and diabetes.  Reviewed treatment options including weight loss, oral appliance, CPAP therapy or referral to ENT for possible surgical options.  Encouraged side sleeping position and weight loss.  Advised against driving if experiencing excessive daytime sleepiness or fatigue.  Follow-up 1 to 2 weeks after completing sleep study to review results and treatment options if needed.   Glenford Bayley, NP 11/17/2021

## 2021-12-29 ENCOUNTER — Ambulatory Visit: Payer: BC Managed Care – PPO | Admitting: Primary Care

## 2021-12-29 DIAGNOSIS — G4733 Obstructive sleep apnea (adult) (pediatric): Secondary | ICD-10-CM

## 2021-12-29 DIAGNOSIS — R0683 Snoring: Secondary | ICD-10-CM

## 2022-01-12 DIAGNOSIS — G4733 Obstructive sleep apnea (adult) (pediatric): Secondary | ICD-10-CM

## 2022-01-22 DIAGNOSIS — G4733 Obstructive sleep apnea (adult) (pediatric): Secondary | ICD-10-CM | POA: Diagnosis not present

## 2022-01-26 NOTE — Progress Notes (Signed)
Sleep study on 01/12/2022 showed moderate obstructive sleep apnea with oxygen desaturation, needs virtual visit to review results and treatment options

## 2022-02-12 ENCOUNTER — Telehealth (INDEPENDENT_AMBULATORY_CARE_PROVIDER_SITE_OTHER): Payer: BC Managed Care – PPO | Admitting: Primary Care

## 2022-02-12 DIAGNOSIS — R0683 Snoring: Secondary | ICD-10-CM | POA: Diagnosis not present

## 2022-02-12 NOTE — Patient Instructions (Signed)

## 2022-02-12 NOTE — Progress Notes (Signed)
Virtual Visit via Video Note  I connected with Jesse Oneill on 02/12/22 at  3:30 PM EST by a video enabled telemedicine application and verified that I am speaking with the correct person using two identifiers.  Location: Patient: Home Provider: Office   I discussed the limitations of evaluation and management by telemedicine and the availability of in person appointments. The patient expressed understanding and agreed to proceed.  History of Present Illness: 36 year old male, never smoked. No significant past medical history.   Previous [facility] pulmonary encounter:  11/17/2021 Patient presents today for sleep consult.  Patient has symptoms of loud snoring, witnessed apnea and daytime sleepiness.  His wife has told him that he sometimes stop breathing at night.  He experiences fatigue even after plenty of rest.  His typical bedtime is between 8 and 10 PM.  He falls asleep quickly.  He does not wake up often at night.  He starts his day between 2 AM and 5 AM depending on his schedule.  He does operate heavy machinery for work.  He has gained 30 to 40 pounds in the last 2 years.  No previous sleep study. Epworth score 13. Denies narcolepsy, cataplexy or sleep walking.   Sleep questionnaire Symptoms- Loud snoring, witnessed apnea, restless sleep, daytime sleepiness Prior sleep study- None  Bedtime- 8-10pm Time to fall asleep- < 10 mins typically   Nocturnal awakenings- None, maybe once to use restroom Out of bed/start of day- 2am-5am  Weight changes- 30-40lbs Do you operate heavy machinery- Yes Do you currently wear CPAP- No Do you current wear oxygen- No Epworth- 13  02/12/2022- Interim hx  Patient contacted today to review sleep study results. HST on 01/12/22 showed moderate OSA, AHI 23.6/hour with SpO2 69% (average 92%).  We reviewed treatment options including oral appliance, CPAP therapy or referral to ENT for possible surgical options.  Due to moderate severity of OSA with oxygen  desaturations and clinical symptoms recommending patient be started on CPAP.  He is in agreement with plan.  He is wanting to work on weight loss but finding it difficult due to fatigue symptoms.  Observations/Objective:  - Appears well without overt respiratory symptoms  Assessment and Plan:  Moderate OSA: - Patient has symptoms of loud snoring, witnessed apnea, restless sleep and daytime sleepiness. Home sleep study on 01/12/22 showed patient has moderate obstructive sleep apnea, AHI 23.6 an hour - Reviewed treatment options, due to severity of his OSA and clinical symptoms recommending patient be started on CPAP.   - DME order placed for auto CPAP 5 to 15 cm H2O. - Educated patient aim to wear CPAP every night for minimum 4 to 6 hours or longer.  Encouraged weight loss efforts.  Advised against driving if experiencing excessive daytime sleepiness or fatigue.  Follow Up Instructions:  - 31- 90 days after starting CPAP for compliance check    I discussed the assessment and treatment plan with the patient. The patient was provided an opportunity to ask questions and all were answered. The patient agreed with the plan and demonstrated an understanding of the instructions.   The patient was advised to call back or seek an in-person evaluation if the symptoms worsen or if the condition fails to improve as anticipated.  I provided 22 minutes of non-face-to-face time during this encounter.   Glenford Bayley, NP

## 2022-02-12 NOTE — Progress Notes (Signed)
Reviewed and agree with assessment/plan.   Coralyn Helling, MD [facility] Pulmonary/Critical Care 02/12/2022, 4:27 PM Pager:  [phone]

## 2022-05-04 ENCOUNTER — Encounter (HOSPITAL_BASED_OUTPATIENT_CLINIC_OR_DEPARTMENT_OTHER): Payer: Self-pay

## 2022-05-04 ENCOUNTER — Emergency Department (HOSPITAL_BASED_OUTPATIENT_CLINIC_OR_DEPARTMENT_OTHER): Payer: BC Managed Care – PPO

## 2022-05-04 ENCOUNTER — Encounter: Payer: Self-pay | Admitting: Emergency Medicine

## 2022-05-04 ENCOUNTER — Ambulatory Visit
Admission: EM | Admit: 2022-05-04 | Discharge: 2022-05-04 | Disposition: A | Discharge status: Home / Self Care | Payer: BC Managed Care – PPO | Attending: Physician Assistant | Admitting: Physician Assistant

## 2022-05-04 ENCOUNTER — Emergency Department (HOSPITAL_BASED_OUTPATIENT_CLINIC_OR_DEPARTMENT_OTHER)
Admission: EM | Admit: 2022-05-04 | Discharge: 2022-05-05 | Disposition: A | Discharge status: Home / Self Care | Payer: BC Managed Care – PPO | Attending: Emergency Medicine | Admitting: Emergency Medicine

## 2022-05-04 ENCOUNTER — Other Ambulatory Visit: Payer: Self-pay

## 2022-05-04 DIAGNOSIS — Z1152 Encounter for screening for COVID-19: Secondary | ICD-10-CM | POA: Diagnosis not present

## 2022-05-04 DIAGNOSIS — R197 Diarrhea, unspecified: Secondary | ICD-10-CM | POA: Diagnosis not present

## 2022-05-04 DIAGNOSIS — R1030 Lower abdominal pain, unspecified: Secondary | ICD-10-CM | POA: Diagnosis present

## 2022-05-04 DIAGNOSIS — A084 Viral intestinal infection, unspecified: Secondary | ICD-10-CM | POA: Diagnosis not present

## 2022-05-04 DIAGNOSIS — R112 Nausea with vomiting, unspecified: Secondary | ICD-10-CM | POA: Insufficient documentation

## 2022-05-04 LAB — COMPREHENSIVE METABOLIC PANEL
ALT: 29 U/L (ref 0–44)
AST: 19 U/L (ref 15–41)
Albumin: 4.9 g/dL (ref 3.5–5.0)
Alkaline Phosphatase: 77 U/L (ref 38–126)
Anion gap: 11 (ref 5–15)
BUN: 15 mg/dL (ref 6–20)
CO2: 27 mmol/L (ref 22–32)
Calcium: 9.9 mg/dL (ref 8.9–10.3)
Chloride: 100 mmol/L (ref 98–111)
Creatinine, Ser: 1.03 mg/dL (ref 0.61–1.24)
GFR, Estimated: >60 mL/min (ref >60)
Glucose, Bld: 103 mg/dL — ABNORMAL HIGH (ref 70–99)
Potassium: 3.9 mmol/L (ref 3.5–5.1)
Sodium: 138 mmol/L (ref 135–145)
Total Bilirubin: 1.2 mg/dL (ref 0.3–1.2)
Total Protein: 8.5 g/dL — ABNORMAL HIGH (ref 6.5–8.1)

## 2022-05-04 LAB — CBC
HCT: 50.4 % (ref 39.0–52.0)
Hemoglobin: 17.2 g/dL — ABNORMAL HIGH (ref 13.0–17.0)
MCH: 29.2 pg (ref 26.0–34.0)
MCHC: 34.1 g/dL (ref 30.0–36.0)
MCV: 85.4 fL (ref 80.0–100.0)
Platelets: 348 10*3/uL (ref 150–400)
RBC: 5.9 MIL/uL — ABNORMAL HIGH (ref 4.22–5.81)
RDW: 13.2 % (ref 11.5–15.5)
WBC: 10.7 10*3/uL — ABNORMAL HIGH (ref 4.0–10.5)
nRBC: 0 % (ref 0.0–0.2)

## 2022-05-04 LAB — RESP PANEL BY RT-PCR (RSV, FLU A&B, COVID)  RVPGX2
Influenza A by PCR: NEGATIVE
Influenza B by PCR: NEGATIVE
Resp Syncytial Virus by PCR: NEGATIVE
SARS Coronavirus 2 by RT PCR: NEGATIVE

## 2022-05-04 LAB — LIPASE, BLOOD: Lipase: 34 U/L (ref 11–51)

## 2022-05-04 MED ORDER — IOHEXOL 300 MG/ML  SOLN
100.0000 mL | Freq: Once | INTRAMUSCULAR | Status: AC | PRN
  Administered 2022-05-04: 100 mL via INTRAVENOUS

## 2022-05-04 MED ORDER — SODIUM CHLORIDE 0.9 % IV BOLUS
1000.0000 mL | Freq: Once | INTRAVENOUS | Status: AC
  Administered 2022-05-04: 1000 mL via INTRAVENOUS

## 2022-05-04 MED ORDER — ONDANSETRON 4 MG PO TBDP: 4.0000 mg | ORAL_TABLET | Freq: Once | ORAL | Status: DC | PRN

## 2022-05-04 MED ORDER — ONDANSETRON 4 MG PO TBDP: 4.0000 mg | ORAL_TABLET | Freq: Three times a day (TID) | ORAL | 0 refills | Status: DC | PRN

## 2022-05-04 MED ORDER — PANTOPRAZOLE SODIUM 40 MG IV SOLR
40.0000 mg | Freq: Once | INTRAVENOUS | Status: AC
  Administered 2022-05-04: 40 mg via INTRAVENOUS
  Filled 2022-05-04: qty 10

## 2022-05-04 MED ORDER — MORPHINE SULFATE (PF) 4 MG/ML IV SOLN
4.0000 mg | Freq: Once | INTRAVENOUS | Status: AC
  Administered 2022-05-05: 4 mg via INTRAVENOUS
  Filled 2022-05-04: qty 1

## 2022-05-04 MED ORDER — ONDANSETRON HCL 4 MG/2ML IJ SOLN
4.0000 mg | Freq: Once | INTRAMUSCULAR | Status: AC
  Administered 2022-05-04: 4 mg via INTRAVENOUS
  Filled 2022-05-04: qty 2

## 2022-05-04 MED ORDER — ONDANSETRON 8 MG PO TBDP
8.0000 mg | ORAL_TABLET | Freq: Once | ORAL | Status: AC
  Administered 2022-05-04: 8 mg via ORAL

## 2022-05-04 MED ORDER — FAMOTIDINE IN NACL 20-0.9 MG/50ML-% IV SOLN
20.0000 mg | Freq: Once | INTRAVENOUS | Status: AC
  Administered 2022-05-04: 20 mg via INTRAVENOUS
  Filled 2022-05-04: qty 50

## 2022-05-04 NOTE — ED Provider Notes (Signed)
[facility]    CSN: [id number] Arrival date & time: 05/04/22  1340      History   Chief Complaint Chief Complaint  Patient presents with   Abdominal Pain   Emesis    HPI Jesse Oneill is a 37 y.o. male presents for evaluation of nausea/vomiting/diarrhea.  Patient reports 2 to 3 days ago he developed intermittent lower abdominal pain that seem to resolve on its own.  Yesterday he developed nausea with nonbloody vomiting and 2 episodes of nonbloody diarrhea.  Abdominal pain remains intermittent.  Denies any fevers, chills, URI symptoms.  No history of GI diagnoses in the past including Crohn's, IBS, diverticulitis.  No history of abdominal surgeries.  He has been taking Pepto-Bismol OTC.  No other concerns at this time.   Abdominal Pain Associated symptoms: diarrhea, nausea and vomiting   Emesis Associated symptoms: abdominal pain and diarrhea     History reviewed. No pertinent past medical history.  Patient Active Problem List   Diagnosis Date Noted   Snoring 10/01/2021   Routine general medical examination at a health care facility 01/11/2016   Advance care planning 01/11/2016    Past Surgical History:  Procedure Laterality Date   INCISION AND DRAINAGE OF PERITONSILLAR ABCESS         Home Medications    Prior to Admission medications   Medication Sig Start Date End Date Taking? Authorizing Provider  ondansetron (ZOFRAN-ODT) 4 MG disintegrating tablet Take 1 tablet (4 mg total) by mouth every 8 (eight) hours as needed for nausea or vomiting. 05/04/22  Yes Melynda Ripple, NP    Family History Family History  Problem Relation Age of Onset   Colon cancer Neg Hx    Prostate cancer Neg Hx     Social History Social History   Tobacco Use   Smoking status: Never   Smokeless tobacco: Never  Vaping Use   Vaping Use: Never used  Substance Use Topics   Alcohol use: No   Drug use: No     Allergies   Patient has no known allergies.   Review of  Systems Review of Systems  Gastrointestinal:  Positive for abdominal pain, diarrhea, nausea and vomiting.     Physical Exam Triage Vital Signs ED Triage Vitals  Enc Vitals Group     BP 05/04/22 1350 (!) 143/107     Pulse Rate 05/04/22 1350 100     Resp 05/04/22 1350 15     Temp 05/04/22 1350 98.2 F (36.8 C)     Temp Source 05/04/22 1350 Oral     SpO2 05/04/22 1350 98 %     Weight 05/04/22 1349 290 lb (131.5 kg)     Height 05/04/22 1349 6\' 4"  (1.93 m)     Head Circumference --      Peak Flow --      Pain Score 05/04/22 1349 7     Pain Loc --      Pain Edu? --      Excl. in [place]? --    No data found.  Updated Vital Signs BP (!) 143/107 (BP Location: Left Arm)   Pulse 100   Temp 98.2 F (36.8 C) (Oral)   Resp 15   Ht 6\' 4"  (1.93 m)   Wt 290 lb (131.5 kg)   SpO2 98%   BMI 35.30 kg/m   Visual Acuity Right Eye Distance:   Left Eye Distance:   Bilateral Distance:    Right Eye Near:  Left Eye Near:    Bilateral Near:     Physical Exam Vitals and nursing note reviewed.  Constitutional:      Appearance: He is well-developed.  HENT:     Head: Normocephalic and atraumatic.  Eyes:     Pupils: Pupils are equal, round, and reactive to light.  Cardiovascular:     Rate and Rhythm: Normal rate.  Pulmonary:     Effort: Pulmonary effort is normal.  Abdominal:     General: Bowel sounds are normal.     Palpations: Abdomen is soft. There is no hepatomegaly or splenomegaly.     Tenderness: There is no abdominal tenderness. Negative signs include McBurney's sign.     Comments: Abd is obese.   Skin:    General: Skin is warm and dry.  Neurological:     General: No focal deficit present.     Mental Status: He is alert and oriented to person, place, and time.  Psychiatric:        Mood and Affect: Mood normal.        Behavior: Behavior normal.      UC Treatments / Results  Labs (all labs ordered are listed, but only abnormal results are displayed) Labs Reviewed   RESP PANEL BY RT-PCR (RSV, FLU A&B, COVID)  [id number]    EKG   Radiology No results found.  Procedures Procedures (including critical care time)  Medications Ordered in UC Medications  ondansetron (ZOFRAN-ODT) disintegrating tablet 8 mg (8 mg Oral Given 05/04/22 1351)    Initial Impression / Assessment and Plan / UC Course  I have reviewed the triage vital signs and the nursing notes.  Pertinent labs & imaging results that were available during my care of the patient were reviewed by me and considered in my medical decision making (see chart for details).     Reviewed exam and symptoms with patient.  No red flags on exam. COVID/flu/RSV PCR and will contact with results of positive Discussed likely viral enteritis and symptomatic treatment Zofran as needed nausea/vomiting.  He was given 1 dose in clinic with improvement in symptoms Discussed rest and hydration with Gatorade, Powerade, Pedialyte Follow-up with PCP in 2 to 3 days for recheck Strict ER precautions reviewed and patient verbalized understanding  Final Clinical Impressions(s) / UC Diagnoses   Final diagnoses:  Viral enteritis     Discharge Instructions      Zofran every 8 hours as needed for nausea/vomiting Increase fluids with Gatorade, Powerade, Pedialyte, water Rest The clinical contact you if any results of the respiratory swab done today is positive.  We do not call negative results. Brat diet and advance as tolerated Follow-up with your PCP in 2 days for recheck Please go to the emergency room if you have any worsening symptoms   ED Prescriptions     Medication Sig Dispense Auth. Provider   ondansetron (ZOFRAN-ODT) 4 MG disintegrating tablet Take 1 tablet (4 mg total) by mouth every 8 (eight) hours as needed for nausea or vomiting. 20 tablet Melynda Ripple, NP      PDMP not reviewed this encounter.   Melynda Ripple, NP 05/04/22 [id number]

## 2022-05-04 NOTE — ED Provider Notes (Incomplete)
[facility] Provider Note   CSN: [id number] Arrival date & time: 05/04/22  1941     History {Add pertinent medical, surgical, social history, OB history to HPI:1} Chief Complaint  Patient presents with  . Abdominal Pain    Jesse Oneill is a 37 y.o. male.  Patient presents to the emergency room complaining of lower abdominal pain, nausea, vomiting.  Patient also endorses diarrhea.  Patient states that symptoms began on Tuesday.  They are intermittent over Tuesday and Wednesday.  They became more consistent on Thursday.  Today he went to urgent care and was prescribed Zofran for nausea.  He states he went home and drank water and ate applesauce and vomited almost right away.  He states his pain is worse than it was before.  The pain is described as a burning pain in the lower abdomen.  He is unable to isolate it to 1 quadrant.  He states that yesterday he believes he may be constipated prior to the onset of diarrhea and took MiraLAX.  He denies urinary symptoms.  Denies chest pain, shortness of breath, fever.  Past medical history noncontributory  HPI     Home Medications Prior to Admission medications   Medication Sig Start Date End Date Taking? Authorizing Provider  ondansetron (ZOFRAN-ODT) 4 MG disintegrating tablet Take 1 tablet (4 mg total) by mouth every 8 (eight) hours as needed for nausea or vomiting. 05/04/22   Melynda Ripple, NP      Allergies    Patient has no known allergies.    Review of Systems   Review of Systems  Gastrointestinal:  Positive for abdominal pain, diarrhea, nausea and vomiting.    Physical Exam Updated Vital Signs BP (!) 142/96 (BP Location: Right Arm)   Pulse (!) 111   Temp 97.7 F (36.5 C)   Resp 20   Ht 6\' 4"  (1.93 m)   Wt 131.5 kg   SpO2 97%   BMI 35.30 kg/m  Physical Exam Vitals and nursing note reviewed.  Constitutional:      General: He is not in acute distress.    Appearance: He is  well-developed.  HENT:     Head: Normocephalic and atraumatic.  Eyes:     Conjunctiva/sclera: Conjunctivae normal.  Cardiovascular:     Rate and Rhythm: Normal rate and regular rhythm.     Heart sounds: No murmur heard. Pulmonary:     Effort: Pulmonary effort is normal. No respiratory distress.     Breath sounds: Normal breath sounds.  Abdominal:     General: Bowel sounds are normal.     Palpations: Abdomen is soft.     Tenderness: There is no abdominal tenderness (No significant tenderness to palpation of any abdominal region).  Musculoskeletal:        General: No swelling.     Cervical back: Neck supple.  Skin:    General: Skin is warm and dry.     Capillary Refill: Capillary refill takes less than 2 seconds.  Neurological:     Mental Status: He is alert.  Psychiatric:        Mood and Affect: Mood normal.     ED Results / Procedures / Treatments   Labs (all labs ordered are listed, but only abnormal results are displayed) Labs Reviewed  COMPREHENSIVE METABOLIC PANEL - Abnormal; Notable for the following components:      Result Value   Glucose, Bld 103 (*)    Total Protein 8.5 (*)  All other components within normal limits  CBC - Abnormal; Notable for the following components:   WBC 10.7 (*)    RBC 5.90 (*)    Hemoglobin 17.2 (*)    All other components within normal limits  LIPASE, BLOOD  URINALYSIS, ROUTINE W REFLEX MICROSCOPIC    EKG None  Radiology No results found.  Procedures Procedures  {Document cardiac monitor, telemetry assessment procedure when appropriate:1}  Medications Ordered in ED Medications  famotidine (PEPCID) IVPB 20 mg premix (20 mg Intravenous New Bag/Given 05/04/22 2255)  iohexol (OMNIPAQUE) 300 MG/ML solution 100 mL (has no administration in time range)  pantoprazole (PROTONIX) injection 40 mg (40 mg Intravenous Given 05/04/22 2258)  ondansetron (ZOFRAN) injection 4 mg (4 mg Intravenous Given 05/04/22 2258)  sodium chloride 0.9 %  bolus 1,000 mL (1,000 mLs Intravenous New Bag/Given 05/04/22 2300)    ED Course/ Medical Decision Making/ A&P   {   Click here for ABCD2, HEART and other calculatorsREFRESH Note before signing :1}                          Medical Decision Making Amount and/or Complexity of Data Reviewed Labs: ordered. Radiology: ordered.  Risk Prescription drug management.   This patient presents to the ED for concern of abdominal pain, nausea, vomiting, diarrhea, this involves an extensive number of treatment options, and is a complaint that carries with it a high risk of complications and morbidity.  The differential diagnosis includes gastroenteritis, cholecystitis, appendicitis, and others   Co morbidities that complicate the patient evaluation  None   Additional history obtained:  Additional history obtained from visitor bedside External records from outside source obtained and reviewed including urgent care notes from earlier today   Lab Tests:  I Ordered, and personally interpreted labs.  The pertinent results include: Unremarkable CMP, CBC, lipase 34   Imaging Studies ordered:  I ordered imaging studies including CT abdomen pelvis with contrast I independently visualized and interpreted imaging which showed  1. Posterior left lower lobe tree-in-bud nodularity. Findings  suggestive of atypical infection.  2. Tiny hiatal hernia.  3. Hepatic steatosis.   I agree with the radiologist interpretation   Problem List / ED Course / Critical interventions / Medication management   I ordered medication including saline bolus for fluid resuscitation, Protonix and Pepcid for reflux-like symptoms, Zofran for nausea Reevaluation of the patient after these medicines showed that the patient stayed the same I have reviewed the patients home medicines and have made adjustments as needed   Test / Admission - Considered:  Patient continues to have severe abdominal pain. Morphine has been  ordered but not administered as of now. Patient care is being transferred to Dr.Sheldon at shift handoff. Patient's presentation currently consistent with gastroenteritis. Patient has Zofran prescription at home. CT scan was unremarkable. Patient with no fever, no cough to suggest CAP. No signs of cholecystitis, appendicitis, pancreatitis.  Disposition pending reassessment post pain medication and successful PO challenge  {Document critical care time when appropriate:1} {Document review of labs and clinical decision tools ie heart score, Chads2Vasc2 etc:1}  {Document your independent review of radiology images, and any outside records:1} {Document your discussion with family members, caretakers, and with consultants:1} {Document social determinants of health affecting pt's care:1} {Document your decision making why or why not admission, treatments were needed:1} Final Clinical Impression(s) / ED Diagnoses Final diagnoses:  None    Rx / DC Orders ED Discharge Orders  None       

## 2022-05-04 NOTE — ED Triage Notes (Signed)
Patient arrives to ED POV c/o Abdominal Pain x4 days. Per pt he went to UC today and was prescribed zofran but has still been in pain. Pt states pain is triggered when eating food. Pt has had N/V/D.

## 2022-05-04 NOTE — ED Provider Notes (Signed)
[facility] Provider Note   CSN: [id number] Arrival date & time: 05/04/22  1941     History  Chief Complaint  Patient presents with   Abdominal Pain    Jesse Oneill is a 37 y.o. male.  Patient presents to the emergency room complaining of lower abdominal pain, nausea, vomiting.  Patient also endorses diarrhea.  Patient states that symptoms began on Tuesday.  They are intermittent over Tuesday and Wednesday.  They became more consistent on Thursday.  Today he went to urgent care and was prescribed Zofran for nausea.  He states he went home and drank water and ate applesauce and vomited almost right away.  He states his pain is worse than it was before.  The pain is described as a burning pain in the lower abdomen.  He is unable to isolate it to 1 quadrant.  He states that yesterday he believes he may be constipated prior to the onset of diarrhea and took MiraLAX.  He denies urinary symptoms.  Denies chest pain, shortness of breath, fever.  Past medical history noncontributory  HPI     Home Medications Prior to Admission medications   Medication Sig Start Date End Date Taking? Authorizing Provider  ondansetron (ZOFRAN-ODT) 4 MG disintegrating tablet Take 1 tablet (4 mg total) by mouth every 8 (eight) hours as needed for nausea or vomiting. 05/04/22   Melynda Ripple, NP      Allergies    Patient has no known allergies.    Review of Systems   Review of Systems  Gastrointestinal:  Positive for abdominal pain, diarrhea, nausea and vomiting.    Physical Exam Updated Vital Signs BP (!) 142/96 (BP Location: Right Arm)   Pulse (!) 111   Temp 97.7 F (36.5 C)   Resp 20   Ht 6\' 4"  (1.93 m)   Wt 131.5 kg   SpO2 97%   BMI 35.30 kg/m  Physical Exam Vitals and nursing note reviewed.  Constitutional:      General: He is not in acute distress.    Appearance: He is well-developed.  HENT:     Head: Normocephalic and atraumatic.  Eyes:      Conjunctiva/sclera: Conjunctivae normal.  Cardiovascular:     Rate and Rhythm: Normal rate and regular rhythm.     Heart sounds: No murmur heard. Pulmonary:     Effort: Pulmonary effort is normal. No respiratory distress.     Breath sounds: Normal breath sounds.  Abdominal:     General: Bowel sounds are normal.     Palpations: Abdomen is soft.     Tenderness: There is no abdominal tenderness (No significant tenderness to palpation of any abdominal region).  Musculoskeletal:        General: No swelling.     Cervical back: Neck supple.  Skin:    General: Skin is warm and dry.     Capillary Refill: Capillary refill takes less than 2 seconds.  Neurological:     Mental Status: He is alert.  Psychiatric:        Mood and Affect: Mood normal.     ED Results / Procedures / Treatments   Labs (all labs ordered are listed, but only abnormal results are displayed) Labs Reviewed  COMPREHENSIVE METABOLIC PANEL - Abnormal; Notable for the following components:      Result Value   Glucose, Bld 103 (*)    Total Protein 8.5 (*)    All other components within normal  limits  CBC - Abnormal; Notable for the following components:   WBC 10.7 (*)    RBC 5.90 (*)    Hemoglobin 17.2 (*)    All other components within normal limits  LIPASE, BLOOD    EKG None  Radiology CT Abdomen Pelvis W Contrast  Result Date: 05/04/2022 CLINICAL DATA:  Abdominal pain, acute, nonlocalized. Abdominal Pain x4 days. Per pt he went to UC today and was prescribed zofran but has still been in pain. Pt states pain is triggered when eating food. Pt has had N/V/D EXAM: CT ABDOMEN AND PELVIS WITH CONTRAST TECHNIQUE: Multidetector CT imaging of the abdomen and pelvis was performed using the standard protocol following bolus administration of intravenous contrast. RADIATION DOSE REDUCTION: This exam was performed according to the departmental dose-optimization program which includes automated exposure control, adjustment of  the mA and/or kV according to patient size and/or use of iterative reconstruction technique. CONTRAST:  157mL OMNIPAQUE IOHEXOL 300 MG/ML  SOLN COMPARISON:  None Available. FINDINGS: Lower chest: Posterior left lower lobe tree-in-bud nodularity. Tiny hiatal hernia. Coronary artery calcification. Hepatobiliary: The hepatic parenchyma is diffusely hypodense compared to the splenic parenchyma consistent with fatty infiltration. No focal liver abnormality. No gallstones, gallbladder wall thickening, or pericholecystic fluid. No biliary dilatation. Pancreas: No focal lesion. Normal pancreatic contour. No surrounding inflammatory changes. No main pancreatic ductal dilatation. Spleen: Normal in size without focal abnormality. Adrenals/Urinary Tract: No adrenal nodule bilaterally. Bilateral kidneys enhance symmetrically. No hydronephrosis. No hydroureter. The urinary bladder is unremarkable. Stomach/Bowel: Stomach is within normal limits. Slightly prominent mucosa and bowel wall thickening of several loops of small bowel within the right lower quadrant likely due to under distension. No pneumatosis. No surrounding bowel fat stranding. No evidence of large bowel wall thickening or dilatation. Appendix appears normal. Vascular/Lymphatic: No abdominal aorta or iliac aneurysm. No abdominal, pelvic, or inguinal lymphadenopathy. Reproductive: Prostate is unremarkable. Other: No intraperitoneal free fluid. No intraperitoneal free gas. No organized fluid collection. Musculoskeletal: No abdominal wall hernia or abnormality. No suspicious lytic or blastic osseous lesions. No acute displaced fracture. Mild retrolisthesis of L5 on S1. Mild posterior disc osteophyte complex formation at the L5-S1 level. IMPRESSION: 1. Posterior left lower lobe tree-in-bud nodularity. Findings suggestive of atypical infection. 2. Tiny hiatal hernia. 3. Hepatic steatosis. Electronically Signed   By: Iven Finn M.D.   On: 05/04/2022 23:42     Procedures Procedures    Medications Ordered in ED Medications  morphine (PF) 4 MG/ML injection 4 mg (has no administration in time range)  pantoprazole (PROTONIX) injection 40 mg (40 mg Intravenous Given 05/04/22 2258)  famotidine (PEPCID) IVPB 20 mg premix (20 mg Intravenous New Bag/Given 05/04/22 2255)  ondansetron (ZOFRAN) injection 4 mg (4 mg Intravenous Given 05/04/22 2258)  sodium chloride 0.9 % bolus 1,000 mL (1,000 mLs Intravenous New Bag/Given 05/04/22 2300)  iohexol (OMNIPAQUE) 300 MG/ML solution 100 mL (100 mLs Intravenous Contrast Given 05/04/22 2321)    ED Course/ Medical Decision Making/ A&P                             Medical Decision Making Amount and/or Complexity of Data Reviewed Labs: ordered. Radiology: ordered.  Risk Prescription drug management.   This patient presents to the ED for concern of abdominal pain, nausea, vomiting, diarrhea, this involves an extensive number of treatment options, and is a complaint that carries with it a high risk of complications and morbidity.  The differential diagnosis includes  gastroenteritis, cholecystitis, appendicitis, and others   Co morbidities that complicate the patient evaluation  None   Additional history obtained:  Additional history obtained from visitor bedside External records from outside source obtained and reviewed including urgent care notes from earlier today   Lab Tests:  I Ordered, and personally interpreted labs.  The pertinent results include: Unremarkable CMP, CBC, lipase 34   Imaging Studies ordered:  I ordered imaging studies including CT abdomen pelvis with contrast I independently visualized and interpreted imaging which showed  1. Posterior left lower lobe tree-in-bud nodularity. Findings  suggestive of atypical infection.  2. Tiny hiatal hernia.  3. Hepatic steatosis.   I agree with the radiologist interpretation   Problem List / ED Course / Critical interventions / Medication  management   I ordered medication including saline bolus for fluid resuscitation, Protonix and Pepcid for reflux-like symptoms, Zofran for nausea Reevaluation of the patient after these medicines showed that the patient stayed the same I have reviewed the patients home medicines and have made adjustments as needed   Test / Admission - Considered:  Patient continues to have severe abdominal pain. Morphine has been ordered but not administered as of now. Patient care is being transferred to Dr.Sheldon at shift handoff. Patient's presentation currently consistent with gastroenteritis. Patient has Zofran prescription at home. CT scan was unremarkable. Patient with no fever, no cough to suggest CAP. No signs of cholecystitis, appendicitis, pancreatitis.  Disposition pending reassessment post pain medication and successful PO challenge        Final Clinical Impression(s) / ED Diagnoses Final diagnoses:  Nausea vomiting and diarrhea  Lower abdominal pain    Rx / DC Orders ED Discharge Orders     None         Ronny Bacon 05/05/22 0005    Cristie Hem, MD 05/05/22 [id number]

## 2022-05-04 NOTE — ED Triage Notes (Signed)
Patient c/o lower abdominal pain, N/V/D that started 2 days ago.  Patient denies fevers.  Patient denies cold symptoms.

## 2022-05-04 NOTE — Discharge Instructions (Signed)
Zofran every 8 hours as needed for nausea/vomiting Increase fluids with Gatorade, Powerade, Pedialyte, water Rest The clinical contact you if any results of the respiratory swab done today is positive.  We do not call negative results. Brat diet and advance as tolerated Follow-up with your PCP in 2 days for recheck Please go to the emergency room if you have any worsening symptoms

## 2022-05-05 ENCOUNTER — Telehealth: Payer: Self-pay | Admitting: Family Medicine

## 2022-05-05 MED ORDER — DICYCLOMINE HCL 20 MG PO TABS: 20.0000 mg | ORAL_TABLET | Freq: Two times a day (BID) | ORAL | 0 refills | Status: DC | PRN

## 2022-05-05 NOTE — Telephone Encounter (Signed)
Please get update on patient after ER eval.  Thanks.  

## 2022-05-05 NOTE — Discharge Instructions (Signed)
You were seen today for abdominal pain. Your CT scan was reassuring. Please continue to take the prescribed Zofran for nausea. Take small sips of water until you are able to tolerate more oral intake. Return to the emergency department if you are unable to tolerate oral fluids, otherwise follow up as needed with your primary care provider.

## 2022-05-07 ENCOUNTER — Telehealth: Payer: Self-pay

## 2022-05-07 NOTE — Telephone Encounter (Signed)
Noted. Thanks.

## 2022-05-07 NOTE — Telephone Encounter (Signed)
Called and spoke with patient The ED prescribed him pain meds which has helped with the pain level.  He has been drinking fluids, he tried applesauce yesterday and it caused pain again and diarrhea, since then he has just been slowly sipping on liquids as tolerated. They advised him to give it a week and to follow back up if he hasn't seen any improvement. He declines making a follow up appt at [facility] right now. He states he is trying to go back to work by the end of the week and will not know schedule until later this week. He will call back if he gets worse and will call back to schedule hosp follow up when possible.

## 2022-05-07 NOTE — Telephone Encounter (Signed)
Transition Care Management Unsuccessful Follow-up Telephone Call  Date of discharge and from where:   [facility] 05-05-22 Dx: nausea with vomiting Attempts:  1st Attempt  Reason for unsuccessful TCM follow-up call:  Left voice message   Juanda Crumble LPN [facility] Direct Dial [phone]

## 2022-05-08 NOTE — Telephone Encounter (Signed)
Transition Care Management Follow-up Telephone Call Date of discharge and from where:   [facility] 05-05-22 Dx: nausea with vomiting   How have you been since you were released from the hospital? Doing ok  Any questions or concerns? No  Items Reviewed: Did the pt receive and understand the discharge instructions provided? Yes  Medications obtained and verified? Yes  Other? No  Any new allergies since your discharge? No  Dietary orders reviewed? Yes Do you have support at home? Yes   Home Care and Equipment/Supplies: Were home health services ordered? no If so, what is the name of the agency? na  Has the agency set up a time to come to the patient's home? not applicable Were any new equipment or medical supplies ordered?  No What is the name of the medical supply agency? na Were you able to get the supplies/equipment? not applicable Do you have any questions related to the use of the equipment or supplies? No  Functional Questionnaire: (I = Independent and D = Dependent) ADLs: I  Bathing/Dressing- I  Meal Prep- I  Eating- I  Maintaining continence- I  Transferring/Ambulation- I  Managing Meds- I  Follow up appointments reviewed:  PCP Hospital f/u appt confirmed? No  Patient plans to call and schedule fu appt if needed . [facility] f/u appt confirmed? No . Are transportation arrangements needed? No  If their condition worsens, is the pt aware to call PCP or go to the Emergency Dept.? Yes Was the patient provided with contact information for the PCP's office or ED? Yes Was to pt encouraged to call back with questions or concerns? Yes   Juanda Crumble LPN [facility] Direct Dial [phone]

## 2022-05-22 ENCOUNTER — Telehealth (INDEPENDENT_AMBULATORY_CARE_PROVIDER_SITE_OTHER): Payer: BC Managed Care – PPO | Admitting: Primary Care

## 2022-05-22 ENCOUNTER — Encounter: Payer: Self-pay | Admitting: Primary Care

## 2022-05-22 DIAGNOSIS — G4733 Obstructive sleep apnea (adult) (pediatric): Secondary | ICD-10-CM

## 2022-05-22 NOTE — Patient Instructions (Addendum)
Sleep apnea is well controlled on current pressure settings Continue to wear CPAP every night 4-6 hours or longer  Orders: Renew CPAP supplies   Follow-up: 6 month Beth NP for OSA

## 2022-05-22 NOTE — Progress Notes (Signed)
Reviewed and agree with assessment/plan.   Chesley Mires, MD [facility] Pulmonary/Critical Care 05/22/2022, 2:52 PM Pager:  [phone]

## 2022-05-22 NOTE — Progress Notes (Signed)
Virtual Visit via Video Note  I connected with Jesse Oneill on 05/22/22 at  2:00 PM EST by a video enabled telemedicine application and verified that I am speaking with the correct person using two identifiers.  Location: Patient: In car/at work (pulled over) Provider: Office   I discussed the limitations of evaluation and management by telemedicine and the availability of in person appointments. The patient expressed understanding and agreed to proceed.  History of Present Illness: 37 year old male, never smoked. No significant past medical history.   Previous [facility] pulmonary encounter:  11/17/2021 Patient presents today for sleep consult.  Patient has symptoms of loud snoring, witnessed apnea and daytime sleepiness.  His wife has told him that he sometimes stop breathing at night.  He experiences fatigue even after plenty of rest.  His typical bedtime is between 8 and 10 PM.  He falls asleep quickly.  He does not wake up often at night.  He starts his day between 2 AM and 5 AM depending on his schedule.  He does operate heavy machinery for work.  He has gained 30 to 40 pounds in the last 2 years.  No previous sleep study. Epworth score 13. Denies narcolepsy, cataplexy or sleep walking.   Sleep questionnaire Symptoms- Loud snoring, witnessed apnea, restless sleep, daytime sleepiness Prior sleep study- None  Bedtime- 8-10pm Time to fall asleep- < 10 mins typically   Nocturnal awakenings- None, maybe once to use restroom Out of bed/start of day- 2am-5am  Weight changes- 30-40lbs Do you operate heavy machinery- Yes Do you currently wear CPAP- No Do you current wear oxygen- No Epworth- 13  02/12/2022 Patient contacted today to review sleep study results. HST on 01/12/22 showed moderate OSA, AHI 23.6/hour with SpO2 69% (average 92%).  We reviewed treatment options including oral appliance, CPAP therapy or referral to ENT for possible surgical options.  Due to moderate severity of OSA with  oxygen desaturations and clinical symptoms recommending patient be started on CPAP.  He is in agreement with plan.  He is wanting to work on weight loss but finding it difficult due to fatigue symptoms.  05/22/2022 - Interim hx  Patient contacted today for CPAP compliance. Home sleep study on 01/12/22 showed patient has moderate obstructive sleep apnea, AHI 23.6 an hour. Started on auto CPAP in November 2023. He is doing well. He had a couple of days earlier this year where he was sick and found it difficult to wear CPAP. Otherwise he took to wearing CPAP very easily. He reports sleeping better and reports feeling less tired during the day. No longer snoring. No issues with mask fit or pressure settings.   Airview download 04/21/22-05/20/22 22/30 days (73%); 16 days > 4 hours  Average usage days used 5 hours 13 mins Pressure 5-20cm h20 (14. 8cm h20-95%) Airleaks 4.6L/min AHI 1.4   Observations/Objective:  Appears well; No overt shortness of breath/wheezing or cough   Assessment and Plan:  Moderate sleep apnea - HST 01/12/22 >> AHI 23.6 an hour. Started on auto CPAP in November 2023.  - He is 73% compliant with use last 30 days and reports benefit in sleep quality and daytime sleepiness  - Current pressure 5-20cm h20; Residual AHI 1.4/hr  - Needs order for DME to provide him with regular CPAP supplies  - Continue to encourage patient wear CPAP every night min 4-6 hours or longer  - Advised against driving if experiencing excessive daytime sleepiness   Follow Up Instructions:  - 6  months with Eustaquio Maize NP for CPAP compliance than annually    I discussed the assessment and treatment plan with the patient. The patient was provided an opportunity to ask questions and all were answered. The patient agreed with the plan and demonstrated an understanding of the instructions.   The patient was advised to call back or seek an in-person evaluation if the symptoms worsen or if the condition fails to  improve as anticipated.  I provided 22 minutes of non-face-to-face time during this encounter.   Martyn Ehrich, NP

## 2022-06-01 ENCOUNTER — Ambulatory Visit: Payer: BC Managed Care – PPO | Admitting: Family Medicine

## 2022-06-01 ENCOUNTER — Encounter: Payer: Self-pay | Admitting: Family Medicine

## 2022-06-01 VITALS — BP 138/84 | HR 95 | Temp 98.0°F | Ht 76.0 in | Wt 302.0 lb

## 2022-06-01 DIAGNOSIS — R220 Localized swelling, mass and lump, head: Secondary | ICD-10-CM | POA: Diagnosis not present

## 2022-06-01 LAB — CBC WITH DIFFERENTIAL/PLATELET: HCT: 45.3 % (ref 38.5–50.0)

## 2022-06-01 MED ORDER — CETIRIZINE HCL 10 MG PO TABS: 10.0000 mg | ORAL_TABLET | Freq: Every day | ORAL | Status: AC | PRN

## 2022-06-01 MED ORDER — EPINEPHRINE 0.3 MG/0.3ML IJ SOAJ: 0.3000 mg | INTRAMUSCULAR | 1 refills | Status: DC | PRN

## 2022-06-01 NOTE — Patient Instructions (Addendum)
If you have symptoms, then take zyrtec '10mg'$ .  If still getting worse (airway symptoms, short of breath, voice changes), then use epipen and dial 911.   Take care.  Glad to see you. We'll update you about your labs.   We may need to have you see the allergy clinic.  No mammalian meat for now.

## 2022-06-01 NOTE — Progress Notes (Unsigned)
Prev abd sx resolved.    Lip swelling in 04/2021.  No clear trigger.  Was out of town at the time.   Then again this Saturday.  Improved with zyrtec.  No recent tick bites known.   Advised no mammalian meat for now.

## 2022-06-03 DIAGNOSIS — R220 Localized swelling, mass and lump, head: Secondary | ICD-10-CM | POA: Insufficient documentation

## 2022-06-03 NOTE — Assessment & Plan Note (Addendum)
Unclear source, no symptoms now.  Discussed options.  Use Zyrtec if return of symptoms.  If any airway/emergent symptoms then use EpiPen and dial 911.  Routine cautions given to patient.  Check alpha gal panel.  Rationale discussed with patient. Advised no mammalian meat for now, at least until we have that resulted.  If alpha gal panel is negative then consider referral to allergy clinic.  Rationale discussed with patient.  He agrees to plan.  Okay for outpatient follow-up.

## 2022-06-04 ENCOUNTER — Telehealth: Payer: Self-pay | Admitting: Family Medicine

## 2022-06-04 MED ORDER — EPINEPHRINE 0.3 MG/0.3ML IJ SOAJ: 0.3000 mg | INTRAMUSCULAR | 1 refills | Status: AC | PRN

## 2022-06-04 NOTE — Telephone Encounter (Signed)
Patient wife called in and stated that his prescription for EPINEPHrine 0.3 mg/0.3 mL IJ SOAJ injection was accidentally shredded. She was wanting to know if a new one could be wrote out or if it could be sent over to the pharmacy. Please advise. Thank you!

## 2022-06-04 NOTE — Telephone Encounter (Signed)
Called patient and rx needs to be sent to [facility]. Erx sent.

## 2022-06-05 LAB — CBC WITH DIFFERENTIAL/PLATELET
Absolute Monocytes: 725 cells/uL (ref 200–950)
Basophils Absolute: 39 cells/uL (ref 0–200)
Basophils Relative: 0.5 %
Eosinophils Absolute: 640 cells/uL — ABNORMAL HIGH (ref 15–500)
Eosinophils Relative: 8.2 %
Hemoglobin: 15.9 g/dL (ref 13.2–17.1)
Lymphs Abs: 2730 cells/uL (ref 850–3900)
MCH: 29.7 pg (ref 27.0–33.0)
MCHC: 35.1 g/dL (ref 32.0–36.0)
MCV: 84.5 fL (ref 80.0–100.0)
MPV: 10.9 fL (ref 7.5–12.5)
Monocytes Relative: 9.3 %
Neutro Abs: 3666 cells/uL (ref 1500–7800)
Neutrophils Relative %: 47 %
Platelets: 341 10*3/uL (ref 140–400)
RBC: 5.36 10*6/uL (ref 4.20–5.80)
RDW: 13.5 % (ref 11.0–15.0)
Total Lymphocyte: 35 %
WBC: 7.8 10*3/uL (ref 3.8–10.8)

## 2022-06-05 LAB — ALPHA-GAL PANEL
Allergen, Mutton, f88: 0.27 kU/L — ABNORMAL HIGH
Allergen, Pork, f26: 0.19 kU/L — ABNORMAL HIGH
Beef: 0.5 kU/L — ABNORMAL HIGH
CLASS: 1
GALACTOSE-ALPHA-1,3-GALACTOSE IGE*: 1.15 kU/L — ABNORMAL HIGH (ref <0.10)

## 2022-06-05 LAB — INTERPRETATION:

## 2022-06-06 ENCOUNTER — Other Ambulatory Visit: Payer: Self-pay | Admitting: Family Medicine

## 2022-06-06 DIAGNOSIS — R220 Localized swelling, mass and lump, head: Secondary | ICD-10-CM

## 2022-08-01 ENCOUNTER — Ambulatory Visit: Payer: Self-pay | Admitting: Internal Medicine

## 2022-09-18 NOTE — Progress Notes (Unsigned)
NEW PATIENT Date of Service/Encounter:  09/19/22 Referring provider: Joaquim Nam, MD Primary care provider: Joaquim Nam, MD  Subjective:  Jesse Oneill is a 37 y.o. male presenting today for evaluation of lip swelling. History obtained from: chart review and patient.   Iip swelling: Occurred first when going to [place] for a work trip but had an illness at that time-this was in January 2024.  At that time, he had severe stomach cramps, nausea, diarrhea which lasted for about a week. 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  Abdominal symptoms intermittent over the course of the next week. Had one bout of severe abdominal pain and was given morphine at an ER during that time.  During this bout of abdominal symptoms, he had one episodes of lip swelling that lasted only a few hours.  He was told he had a viral gastroenteritis and swelling likely related to virus and/or dehydration.  Second time lip swelling occurred was in February. He woke up on a Saturday morning and noticed some itching on his top lip and his wife told him that his lip was swelling.  20-30 minutes swelling more noticeable.  His wife gave him water and benadryl and his symptoms resolved within a few hours. Saw his PCP and his alpha gal was found positive. Has not occurred since this happened. Avoiding mammalian meat.  PCP visit 06/01/22 for lip swelling: "Lip swelling in 04/2021.  No clear trigger.  Was out of town at the time.  Then again this Saturday.  Improved with zyrtec.  No recent tick bites known.  No similar events other than the 2 episodes described here.  No symptoms now.  No itching, no wheezing." Plan: zyrtec, check alpha gal, referral AI, Epipen PRN Labs: alpha-gal 1.15 - positive  Other allergy screening: Asthma: no Rhino conjunctivitis: no Food allergy: no Medication allergy: no Hymenoptera allergy: no Urticaria: no Eczema:no History of  recurrent infections suggestive of immunodeficency: no  Past Medical History: Past Medical History:  Diagnosis Date   Angio-edema    Moderate obstructive sleep apnea 10/01/2021   Medication List:  Current Outpatient Medications  Medication Sig Dispense Refill   EPINEPHrine 0.3 mg/0.3 mL IJ SOAJ injection Inject 0.3 mg into the muscle as needed for anaphylaxis. Okay to dispense epinephrine auto injector. 2 each 1   cetirizine (ZYRTEC) 10 MG tablet Take 1 tablet (10 mg total) by mouth daily as needed (for lip swelling). (Patient not taking: Reported on 09/19/2022)     Multiple Vitamin (MULTIVITAMIN) capsule Take 1 capsule by mouth daily. (Patient not taking: Reported on 09/19/2022)     Probiotic Product (PROBIOTIC BLEND PO) Take 1 tablet by mouth daily. (Patient not taking: Reported on 09/19/2022)     No current facility-administered medications for this visit.   Known Allergies:  No Known Allergies Past Surgical History: Past Surgical History:  Procedure Laterality Date   INCISION AND DRAINAGE OF PERITONSILLAR ABCESS     Family History: Family History  Problem Relation Age of Onset   Colon cancer Neg Hx    Prostate cancer Neg Hx    Social History: Jesse Oneill lives in a house built 7 years ago, no water damage, carpet in the bedroom, Architectural technologist, central AC, indoor dogs, no roaches, chickens outdoors, using DM protection on bedding and pillows, drives a truck, no HEPA filter in home, home no near interstate[place]industrial [place]. He is not a smoker,  no smoke exposure.   ROS:  All other systems negative except as noted per HPI.  Objective:  Blood pressure (!) 142/90, pulse 90, temperature 98.3 F (36.8 C), temperature source Temporal, resp. rate 16, height 6' 4.18" (1.935 m), weight (!) 304 lb 3.2 oz (138 kg), SpO2 96 %. Body mass index is 36.85 kg/m. Physical Exam:  General Appearance:  Alert, cooperative, no distress, appears stated age  Head:  Normocephalic, without obvious  abnormality, atraumatic  Eyes:  Conjunctiva clear, EOM's intact  Nose: Nares normal, hypertrophic turbinates, normal mucosa, and no visible anterior polyps  Throat: Lips, tongue normal; teeth and gums normal, normal posterior oropharynx  Neck: Supple, symmetrical  Lungs:   clear to auscultation bilaterally, Respirations unlabored, no coughing  Heart:  regular rate and rhythm and no murmur, Appears well perfused  Extremities: No edema  Skin: Skin color, texture, turgor normal and no rashes or lesions on visualized portions of skin  Neurologic: No gross deficits   Diagnostics: Labs today: tryptase  Assessment and Plan  Lip swelling-angioedema - possibly secondary to alpha gal as this was positive Other considerations discussed: idiopathic histamine mediated angioedema, viral related angioedema, mast cell disorders. Do not suspect bradykinin mediated angioedema based on history alone.  - please avoid all mammalian meat, we will retest in one year, it is possible you will outgrow this - okay to continue with dairy products for now - if lip swelling occurs again, please contact our office for follow-up - will obtain screening tryptase to rule out mast cell disorders - for SKIN only reaction, okay to take Benadryl 2 capsules every 4 hours - for SKIN + ANY additional symptoms, OR IF concern for LIFE THREATENING reaction = Epipen Autoinjector EpiPen 0.3 mg. - If using Epinephrine autoinjector, call 911 - A food allergy action plan has been provided and discussed. - Medic Alert identification is recommended.  Mammalian Meat Allergy (galactose-alpha-1, 3-galactose allergy AKA Alpha Gal): - Strictly avoid all mammalian meat (beef, pork, venison, lamb, goat, and bison, etc) - You may continue to eat all poultry and seafood - Always carry your EpiPen with you at all times to be used in case of severe reaction   Follow up : 1 year sooner if swelling occurs again It was a pleasure meeting you in  clinic today! Thank you for allowing me to participate in your care.  This note in its entirety was forwarded to the Provider who requested this consultation.  Thank you for your kind referral. I appreciate the opportunity to take part in Helaman's care. Please do not hesitate to contact me with questions.  Sincerely,  Tonny Bollman, MD [facility]

## 2022-09-19 ENCOUNTER — Other Ambulatory Visit: Payer: Self-pay

## 2022-09-19 ENCOUNTER — Ambulatory Visit: Payer: BC Managed Care – PPO | Admitting: Internal Medicine

## 2022-09-19 ENCOUNTER — Encounter: Payer: Self-pay | Admitting: Internal Medicine

## 2022-09-19 VITALS — BP 142/90 | HR 90 | Temp 98.3°F | Resp 16 | Ht 76.18 in | Wt 304.2 lb

## 2022-09-19 DIAGNOSIS — Z91018 Allergy to other foods: Secondary | ICD-10-CM

## 2022-09-19 DIAGNOSIS — T783XXA Angioneurotic edema, initial encounter: Secondary | ICD-10-CM | POA: Diagnosis not present

## 2022-09-19 NOTE — Patient Instructions (Signed)
Lip swelling-angioedema - possibly secondary to alpha gal as this was positive - please avoid all mammalian meat, we will retest in one year, it is possible you will outgrow this - okay to continue with dairy products for now - if lip swelling occurs again, please contact our office for follow-up - will obtain screening tryptase to rule out mast cell disorders - for SKIN only reaction, okay to take Benadryl 2 capsules every 4 hours - for SKIN + ANY additional symptoms, OR IF concern for LIFE THREATENING reaction = Epipen Autoinjector EpiPen 0.3 mg. - If using Epinephrine autoinjector, call 911 - A food allergy action plan has been provided and discussed. - Medic Alert identification is recommended.  Follow up : 1 year sooner if swelling occurs again It was a pleasure meeting you in clinic today! Thank you for allowing me to participate in your care.  Tonny Bollman, MD [facility]

## 2022-09-21 LAB — TRYPTASE: Tryptase: 5.7 ug/L (ref 2.2–13.2)

## 2022-09-21 NOTE — Progress Notes (Signed)
Please let Mr. Loeffelholz know that his tryptase level (a marker of mast cells) is normal. This rules out mast cell disorders.  For now we will assume his lip swelling has been a result of alpha gal allergy. If he has other swelling episodes, please have him contact our office.

## 2024-05-04 ENCOUNTER — Encounter: Payer: Self-pay | Admitting: Emergency Medicine

## 2024-05-04 ENCOUNTER — Ambulatory Visit
Admission: EM | Admit: 2024-05-04 | Discharge: 2024-05-04 | Disposition: A | Discharge status: Home / Self Care | Source: Home / Self Care | Attending: Emergency Medicine | Admitting: Emergency Medicine

## 2024-05-04 DIAGNOSIS — J101 Influenza due to other identified influenza virus with other respiratory manifestations: Secondary | ICD-10-CM | POA: Diagnosis not present

## 2024-05-04 LAB — POC COVID19/FLU A&B COMBO
Covid Antigen, POC: NEGATIVE
Influenza A Antigen, POC: POSITIVE — AB
Influenza B Antigen, POC: NEGATIVE

## 2024-05-04 MED ORDER — OSELTAMIVIR PHOSPHATE 75 MG PO CAPS: 75.0000 mg | ORAL_CAPSULE | Freq: Two times a day (BID) | ORAL | 0 refills | Status: AC

## 2024-05-04 MED ORDER — BENZONATATE 100 MG PO CAPS: 100.0000 mg | ORAL_CAPSULE | Freq: Three times a day (TID) | ORAL | 0 refills | Status: AC

## 2024-05-04 NOTE — Discharge Instructions (Signed)
 You tested positive for flu A  Will send Tamiflu  and tessalon  (for cough)  Continue use of Tylenol  and sudafed for symptoms.   May return to work when fever free and feeling better for 24 hrs

## 2024-05-04 NOTE — ED Triage Notes (Signed)
 Pt reports a runny nose for several weeks. Today we woke up feeling feverish, bodyaches, cough, dizziness and nausea. Pt has taken tylenol  and mucinex for his symptoms.
